# Patient Record
Sex: Female | Born: 1965
Health system: Southern US, Community
[De-identification: ages and names within clinical notes are randomized; demographics above are authoritative.]

## PROBLEM LIST (undated history)

## (undated) DIAGNOSIS — I10 Essential (primary) hypertension: Secondary | ICD-10-CM

## (undated) DIAGNOSIS — Z973 Presence of spectacles and contact lenses: Secondary | ICD-10-CM

## (undated) DIAGNOSIS — K760 Fatty (change of) liver, not elsewhere classified: Secondary | ICD-10-CM

## (undated) DIAGNOSIS — J301 Allergic rhinitis due to pollen: Secondary | ICD-10-CM

## (undated) DIAGNOSIS — R945 Abnormal results of liver function studies: Secondary | ICD-10-CM

## (undated) DIAGNOSIS — R7989 Other specified abnormal findings of blood chemistry: Secondary | ICD-10-CM

## (undated) HISTORY — DX: Fatty (change of) liver, not elsewhere classified: K76.0

## (undated) HISTORY — PX: CARPAL TUNNEL RELEASE: SHX101

## (undated) HISTORY — DX: Allergic rhinitis due to pollen: J30.1

## (undated) HISTORY — PX: TONSILLECTOMY: SUR1361

## (undated) HISTORY — PX: WISDOM TOOTH EXTRACTION: SHX21

## (undated) HISTORY — DX: Abnormal results of liver function studies: R94.5

## (undated) HISTORY — DX: Other specified abnormal findings of blood chemistry: R79.89

## (undated) HISTORY — DX: Essential (primary) hypertension: I10

---

## 1999-06-28 HISTORY — PX: KNEE ARTHROSCOPY W/ MENISCAL REPAIR: SHX1877

## 2010-02-21 LAB — HM PAP SMEAR: HM Pap smear: NORMAL

## 2012-02-22 ENCOUNTER — Ambulatory Visit (INDEPENDENT_AMBULATORY_CARE_PROVIDER_SITE_OTHER): Payer: PRIVATE HEALTH INSURANCE | Admitting: Internal Medicine

## 2012-02-22 ENCOUNTER — Encounter: Payer: Self-pay | Admitting: Internal Medicine

## 2012-02-22 VITALS — BP 130/100 | HR 71 | Temp 98.4°F | Ht 61.0 in | Wt 155.2 lb

## 2012-02-22 DIAGNOSIS — E785 Hyperlipidemia, unspecified: Secondary | ICD-10-CM

## 2012-02-22 DIAGNOSIS — I10 Essential (primary) hypertension: Secondary | ICD-10-CM

## 2012-02-22 DIAGNOSIS — Z1239 Encounter for other screening for malignant neoplasm of breast: Secondary | ICD-10-CM

## 2012-02-22 DIAGNOSIS — M25561 Pain in right knee: Secondary | ICD-10-CM | POA: Insufficient documentation

## 2012-02-22 DIAGNOSIS — M25569 Pain in unspecified knee: Secondary | ICD-10-CM

## 2012-02-22 DIAGNOSIS — E039 Hypothyroidism, unspecified: Secondary | ICD-10-CM

## 2012-02-22 LAB — MICROALBUMIN / CREATININE URINE RATIO
Microalb Creat Ratio: 1.1 mg/g (ref 0.0–30.0)
Microalb, Ur: 0.6 mg/dL (ref 0.0–1.9)

## 2012-02-22 LAB — COMPREHENSIVE METABOLIC PANEL
AST: 26 U/L (ref 0–37)
Alkaline Phosphatase: 51 U/L (ref 39–117)
BUN: 11 mg/dL (ref 6–23)
Creatinine, Ser: 0.7 mg/dL (ref 0.4–1.2)
Glucose, Bld: 84 mg/dL (ref 70–99)
Total Bilirubin: 0.6 mg/dL (ref 0.3–1.2)

## 2012-02-22 LAB — LDL CHOLESTEROL, DIRECT: Direct LDL: 127.2 mg/dL

## 2012-02-22 LAB — T4, FREE: Free T4: 0.81 ng/dL (ref 0.60–1.60)

## 2012-02-22 MED ORDER — HYDROCHLOROTHIAZIDE 25 MG PO TABS: 25.0000 mg | ORAL_TABLET | Freq: Every day | ORAL | Status: DC

## 2012-02-22 MED ORDER — ATENOLOL 25 MG PO TABS: 25.0000 mg | ORAL_TABLET | Freq: Every day | ORAL | Status: DC

## 2012-02-22 MED ORDER — AMLODIPINE BESYLATE 10 MG PO TABS: 10.0000 mg | ORAL_TABLET | Freq: Every day | ORAL | Status: DC

## 2012-02-22 NOTE — Assessment & Plan Note (Signed)
Blood pressure slightly elevated today. However, patient reports better control at home. We'll check renal function with labs. We'll continue current medications. Followup 6 months or sooner as needed.

## 2012-02-22 NOTE — Progress Notes (Signed)
Subjective:    Patient ID: Shelley Massey, female    DOB: Jul 11, 1965, 46 y.o.   MRN: [id number]  HPI 46 year old female with history of hypertension and hyperlipidemia presents to establish care. She reports that she is generally feeling well. She notes a history of meniscal tear and repair in her right knee. She reports that her initial repair failed and screw had to be removed from her knee. Since that time, she has had significant pain in her right knee and occasional swelling. She would like to set up orthopedic evaluation for this. She recently moved from [place] where she received her orthopedic care.  In regards to hypertension and hyperlipidemia, she reports full compliance with medications. She denies any headache, chest pain, palpitations. She is trying to improve her diet and increase physical activity in an effort to lose weight and improve her health. 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 She has received some care at the [facility] in [facility].  Outpatient Encounter Prescriptions as of 02/22/2012  Medication Sig Dispense Refill  . amLODipine (NORVASC) 10 MG tablet Take 1 tablet (10 mg total) by mouth daily.  90 tablet  4  . aspirin 81 MG tablet Take 81 mg by mouth daily.      Marland Kitchen atenolol (TENORMIN) 25 MG tablet Take 1 tablet (25 mg total) by mouth daily.  90 tablet  4  . Cholecalciferol (VITAMIN D-3) 1000 UNITS CAPS Take 1 capsule by mouth daily.      . Flaxseed, Linseed, (FLAXSEED OIL) 1000 MG CAPS Take 2 capsules by mouth daily.      . hydrochlorothiazide (HYDRODIURIL) 25 MG tablet Take 1 tablet (25 mg total) by mouth daily.  90 tablet  4  . Omega-3 Fatty Acids (FISH OIL) 1200 MG CAPS Take 2 capsules by mouth daily.       BP 130/100  Pulse 71  Temp 98.4 F (36.9 C) (Oral)  Ht 5\' 1"  (1.549 m)  Wt 155 lb 4 oz (70.421 kg)  BMI 29.33 kg/m2  SpO2 97%  LMP 02/11/2012  Review of Systems  Constitutional: Negative for fever, chills,  appetite change, fatigue and unexpected weight change.  HENT: Negative for ear pain, congestion, sore throat, trouble swallowing, neck pain, voice change and sinus pressure.   Eyes: Negative for visual disturbance.  Respiratory: Negative for cough, shortness of breath, wheezing and stridor.   Cardiovascular: Negative for chest pain, palpitations and leg swelling.  Gastrointestinal: Negative for nausea, vomiting, abdominal pain, diarrhea, constipation, blood in stool, abdominal distention and anal bleeding.  Genitourinary: Negative for dysuria and flank pain.  Musculoskeletal: Positive for myalgias and arthralgias. Negative for gait problem.  Skin: Negative for color change and rash.  Neurological: Negative for dizziness and headaches.  Hematological: Negative for adenopathy. Does not bruise/bleed easily.  Psychiatric/Behavioral: Negative for suicidal ideas, disturbed wake/sleep cycle and dysphoric mood. The patient is not nervous/anxious.        Objective:   Physical Exam  Constitutional: She is oriented to person, place, and time. She appears well-developed and well-nourished. No distress.  HENT:  Head: Normocephalic and atraumatic.  Right Ear: External ear normal.  Left Ear: External ear normal.  Nose: Nose normal.  Mouth/Throat: Oropharynx is clear and moist. No oropharyngeal exudate.  Eyes: Conjunctivae are normal. Pupils are equal, round, and reactive to light. Right eye exhibits no discharge. Left eye exhibits no discharge. No scleral icterus.  Neck: Normal range of motion.  Neck supple. No tracheal deviation present. No thyromegaly present.  Cardiovascular: Normal rate, regular rhythm, normal heart sounds and intact distal pulses.  Exam reveals no gallop and no friction rub.   No murmur heard. Pulmonary/Chest: Effort normal and breath sounds normal. No respiratory distress. She has no wheezes. She has no rales. She exhibits no tenderness.  Musculoskeletal: Normal range of motion.  She exhibits no edema and no tenderness.       Right knee: She exhibits normal range of motion, no swelling and no erythema. tenderness found. Medial joint line tenderness noted.  Lymphadenopathy:    She has no cervical adenopathy.  Neurological: She is alert and oriented to person, place, and time. No cranial nerve deficit. She exhibits normal muscle tone. Coordination normal.  Skin: Skin is warm and dry. No rash noted. She is not diaphoretic. No erythema. No pallor.  Psychiatric: She has a normal mood and affect. Her behavior is normal. Judgment and thought content normal.          Assessment & Plan:

## 2012-02-22 NOTE — Assessment & Plan Note (Signed)
History of hyperlipidemia in the past. Lab work today shows improvement in lipids. We'll plan to repeat LFTs and lipids in 6 months. Encouraged continued efforts at healthy diet high in fiber and low in saturated fat. Encourage increased physical activity.

## 2012-02-22 NOTE — Assessment & Plan Note (Signed)
Patient with right knee pain at previous surgical site. Will set up with orthopedics locally for evaluation.

## 2012-02-22 NOTE — Assessment & Plan Note (Signed)
Will set up screening mammogram. 

## 2012-02-24 ENCOUNTER — Encounter: Payer: Self-pay | Admitting: *Deleted

## 2012-08-29 ENCOUNTER — Encounter: Payer: PRIVATE HEALTH INSURANCE | Admitting: Internal Medicine

## 2012-12-25 ENCOUNTER — Ambulatory Visit (INDEPENDENT_AMBULATORY_CARE_PROVIDER_SITE_OTHER): Payer: 59 | Admitting: Adult Health

## 2012-12-25 ENCOUNTER — Encounter: Payer: Self-pay | Admitting: Adult Health

## 2012-12-25 VITALS — BP 126/68 | HR 66 | Resp 12 | Wt 155.0 lb

## 2012-12-25 DIAGNOSIS — M6740 Ganglion, unspecified site: Secondary | ICD-10-CM | POA: Insufficient documentation

## 2012-12-25 NOTE — Progress Notes (Signed)
  Subjective:    Patient ID: Shelley Massey, female    DOB: 08/16/1965, 47 y.o.   MRN: [id number]  HPI  Hx of ganglion cyst left base of thumb 7 years ago. She had a cortisone injection to the area with good results. Patient first noticed it after repetitive movements related to work. She is now in a different role and not using repetitive movements as much as previously. Patient is noticing however recurrence of this ganglion cyst. It is causing some discomfort. She still has good mobility and range of motion  Current Outpatient Prescriptions on File Prior to Visit  Medication Sig Dispense Refill  . amLODipine (NORVASC) 10 MG tablet Take 1 tablet (10 mg total) by mouth daily.  90 tablet  4  . aspirin 81 MG tablet Take 81 mg by mouth daily.      Marland Kitchen atenolol (TENORMIN) 25 MG tablet Take 1 tablet (25 mg total) by mouth daily.  90 tablet  4  . Flaxseed, Linseed, (FLAXSEED OIL) 1000 MG CAPS Take 2 capsules by mouth daily.      . hydrochlorothiazide (HYDRODIURIL) 25 MG tablet Take 1 tablet (25 mg total) by mouth daily.  90 tablet  4  . Omega-3 Fatty Acids (FISH OIL) 1200 MG CAPS Take 2 capsules by mouth daily.       No current facility-administered medications on file prior to visit.     Review of Systems  Positive for pain and discomfort and base of thumb on left hand. Negative for tingling, numbness or loss of sensation  BP 126/68  Pulse 66  Resp 12  Wt 155 lb (70.308 kg)  BMI 29.3 kg/m2  SpO2 96%     Objective:   Physical Exam  Left hand with ganglion cyst at base of thumb on dorsal surface. Good ROM. Tenderness on palpation. Mild edema at base of the thumb. Skin is intact without any s/s of infection or abscess.     Assessment & Plan:

## 2012-12-25 NOTE — Assessment & Plan Note (Signed)
Left base of thumb on dorsal surface. Ibuprofen 600 - 800 mg q 6 hours x 3-4 days. Immobilize area as much as possible with use of splint for 3-4 days. If no improvement within 1-2 weeks or if symptoms become severe will refer to ortho for steroid injection. Discussed that surgical removal is last option and not guaranteed that it will not reoccur.

## 2013-02-18 ENCOUNTER — Encounter: Payer: Self-pay | Admitting: General Practice

## 2013-02-25 ENCOUNTER — Encounter: Payer: Self-pay | Admitting: General Practice

## 2013-03-27 ENCOUNTER — Encounter: Payer: Self-pay | Admitting: General Practice

## 2013-04-15 ENCOUNTER — Other Ambulatory Visit: Payer: Self-pay | Admitting: *Deleted

## 2013-04-15 DIAGNOSIS — E785 Hyperlipidemia, unspecified: Secondary | ICD-10-CM

## 2013-04-15 DIAGNOSIS — Z1239 Encounter for other screening for malignant neoplasm of breast: Secondary | ICD-10-CM

## 2013-04-15 DIAGNOSIS — I10 Essential (primary) hypertension: Secondary | ICD-10-CM

## 2013-04-15 DIAGNOSIS — M25561 Pain in right knee: Secondary | ICD-10-CM

## 2013-04-15 DIAGNOSIS — E039 Hypothyroidism, unspecified: Secondary | ICD-10-CM

## 2013-04-15 MED ORDER — ATENOLOL 25 MG PO TABS: 25.0000 mg | ORAL_TABLET | Freq: Every day | ORAL | Status: DC

## 2013-04-26 ENCOUNTER — Other Ambulatory Visit: Payer: Self-pay | Admitting: *Deleted

## 2013-04-26 ENCOUNTER — Telehealth: Payer: Self-pay | Admitting: *Deleted

## 2013-04-26 DIAGNOSIS — I10 Essential (primary) hypertension: Secondary | ICD-10-CM

## 2013-04-26 DIAGNOSIS — M25561 Pain in right knee: Secondary | ICD-10-CM

## 2013-04-26 DIAGNOSIS — E785 Hyperlipidemia, unspecified: Secondary | ICD-10-CM

## 2013-04-26 DIAGNOSIS — Z1239 Encounter for other screening for malignant neoplasm of breast: Secondary | ICD-10-CM

## 2013-04-26 DIAGNOSIS — E039 Hypothyroidism, unspecified: Secondary | ICD-10-CM

## 2013-04-26 MED ORDER — HYDROCHLOROTHIAZIDE 25 MG PO TABS: 25.0000 mg | ORAL_TABLET | Freq: Every day | ORAL | Status: DC

## 2013-04-26 MED ORDER — AMLODIPINE BESYLATE 10 MG PO TABS: 10.0000 mg | ORAL_TABLET | Freq: Every day | ORAL | Status: DC

## 2013-04-26 NOTE — Telephone Encounter (Signed)
Refill Request  Hctz 25 mg tablet     #90  Take one tablet by mouth daily

## 2013-04-26 NOTE — Telephone Encounter (Signed)
appt 05/06/13

## 2013-05-06 ENCOUNTER — Ambulatory Visit (INDEPENDENT_AMBULATORY_CARE_PROVIDER_SITE_OTHER): Payer: 59 | Admitting: Internal Medicine

## 2013-05-06 ENCOUNTER — Encounter (INDEPENDENT_AMBULATORY_CARE_PROVIDER_SITE_OTHER): Payer: Self-pay

## 2013-05-06 ENCOUNTER — Encounter: Payer: Self-pay | Admitting: Internal Medicine

## 2013-05-06 VITALS — BP 158/98 | HR 77 | Temp 98.2°F | Wt 160.0 lb

## 2013-05-06 DIAGNOSIS — M25561 Pain in right knee: Secondary | ICD-10-CM

## 2013-05-06 DIAGNOSIS — E785 Hyperlipidemia, unspecified: Secondary | ICD-10-CM

## 2013-05-06 DIAGNOSIS — M6740 Ganglion, unspecified site: Secondary | ICD-10-CM

## 2013-05-06 DIAGNOSIS — Z1239 Encounter for other screening for malignant neoplasm of breast: Secondary | ICD-10-CM

## 2013-05-06 DIAGNOSIS — I10 Essential (primary) hypertension: Secondary | ICD-10-CM

## 2013-05-06 DIAGNOSIS — M25569 Pain in unspecified knee: Secondary | ICD-10-CM

## 2013-05-06 DIAGNOSIS — E039 Hypothyroidism, unspecified: Secondary | ICD-10-CM

## 2013-05-06 LAB — COMPREHENSIVE METABOLIC PANEL
Albumin: 4.2 g/dL (ref 3.5–5.2)
BUN: 11 mg/dL (ref 6–23)
CO2: 28 mEq/L (ref 19–32)
Calcium: 9.4 mg/dL (ref 8.4–10.5)
Chloride: 99 mEq/L (ref 96–112)
Creatinine, Ser: 0.7 mg/dL (ref 0.4–1.2)
GFR: 104.81 mL/min (ref >60.00)
Glucose, Bld: 98 mg/dL (ref 70–99)
Potassium: 4 mEq/L (ref 3.5–5.1)

## 2013-05-06 LAB — LDL CHOLESTEROL, DIRECT: Direct LDL: 142.9 mg/dL

## 2013-05-06 LAB — LIPID PANEL
HDL: 49.1 mg/dL (ref >39.00)
Triglycerides: 168 mg/dL — ABNORMAL HIGH (ref 0.0–149.0)

## 2013-05-06 LAB — MICROALBUMIN / CREATININE URINE RATIO
Creatinine,U: 134.6 mg/dL
Microalb Creat Ratio: 1.7 mg/g (ref 0.0–30.0)
Microalb, Ur: 2.3 mg/dL — ABNORMAL HIGH (ref 0.0–1.9)

## 2013-05-06 MED ORDER — AMLODIPINE BESYLATE 10 MG PO TABS: 10.0000 mg | ORAL_TABLET | Freq: Every day | ORAL | Status: DC

## 2013-05-06 MED ORDER — HYDROCHLOROTHIAZIDE 25 MG PO TABS: 25.0000 mg | ORAL_TABLET | Freq: Every day | ORAL | Status: DC

## 2013-05-06 MED ORDER — ATENOLOL 25 MG PO TABS: 25.0000 mg | ORAL_TABLET | Freq: Every day | ORAL | Status: DC

## 2013-05-06 NOTE — Progress Notes (Signed)
Subjective:    Patient ID: Shelley Massey, female    DOB: 04-06-1966, 47 y.o.   MRN: [id number]  HPI 47YO female with h/o hypertension presents for follow up. Compliant with BP meds. BP at home typically 120s/80s or lower. No recent chest pain, palpitations, dyspnea.  Concerned about persistent cystic area on wrist. Area has been drained in the past and also had steroid injection, however area continues to swell and be painful intermittently. Swelling "comes and goes" and is not currently present, however area remains tender. No known trauma to area.  Outpatient Encounter Prescriptions as of 05/06/2013  Medication Sig  . amLODipine (NORVASC) 10 MG tablet Take 1 tablet (10 mg total) by mouth daily.  Marland Kitchen aspirin 81 MG tablet Take 81 mg by mouth daily.  Marland Kitchen atenolol (TENORMIN) 25 MG tablet Take 1 tablet (25 mg total) by mouth daily.  . Flaxseed, Linseed, (FLAXSEED OIL) 1000 MG CAPS Take 2 capsules by mouth daily.  . hydrochlorothiazide (HYDRODIURIL) 25 MG tablet Take 1 tablet (25 mg total) by mouth daily.  . Omega-3 Fatty Acids (FISH OIL) 1200 MG CAPS Take 2 capsules by mouth daily.   BP 158/98  Pulse 77  Temp(Src) 98.2 F (36.8 C) (Oral)  Wt 160 lb (72.576 kg)  SpO2 97%  Review of Systems  Constitutional: Negative for fever, chills, appetite change, fatigue and unexpected weight change.  HENT: Negative for congestion, ear pain, sinus pressure, sore throat, trouble swallowing and voice change.   Eyes: Negative for visual disturbance.  Respiratory: Negative for cough, shortness of breath, wheezing and stridor.   Cardiovascular: Negative for chest pain, palpitations and leg swelling.  Gastrointestinal: Negative for nausea, vomiting, abdominal pain, diarrhea, constipation, blood in stool, abdominal distention and anal bleeding.  Genitourinary: Negative for dysuria and flank pain.  Musculoskeletal: Positive for myalgias. Negative for arthralgias, gait problem and neck pain.  Skin: Negative for  color change and rash.  Neurological: Negative for dizziness and headaches.  Hematological: Negative for adenopathy. Does not bruise/bleed easily.  Psychiatric/Behavioral: Negative for suicidal ideas, sleep disturbance and dysphoric mood. The patient is not nervous/anxious.        Objective:   Physical Exam  Constitutional: She is oriented to person, place, and time. She appears well-developed and well-nourished. No distress.  HENT:  Head: Normocephalic and atraumatic.  Right Ear: External ear normal.  Left Ear: External ear normal.  Nose: Nose normal.  Mouth/Throat: Oropharynx is clear and moist. No oropharyngeal exudate.  Eyes: Conjunctivae are normal. Pupils are equal, round, and reactive to light. Right eye exhibits no discharge. Left eye exhibits no discharge. No scleral icterus.  Neck: Normal range of motion. Neck supple. No tracheal deviation present. No thyromegaly present.  Cardiovascular: Normal rate, regular rhythm, normal heart sounds and intact distal pulses.  Exam reveals no gallop and no friction rub.   No murmur heard. Pulmonary/Chest: Effort normal and breath sounds normal. No accessory muscle usage. Not tachypneic. No respiratory distress. She has no decreased breath sounds. She has no wheezes. She has no rhonchi. She has no rales. She exhibits no tenderness.  Musculoskeletal: Normal range of motion. She exhibits no edema.       Left wrist: She exhibits tenderness and swelling.       Arms: Lymphadenopathy:    She has no cervical adenopathy.  Neurological: She is alert and oriented to person, place, and time. No cranial nerve deficit. She exhibits normal muscle tone. Coordination normal.  Skin: Skin is warm and dry. No rash  noted. She is not diaphoretic. No erythema. No pallor.  Psychiatric: She has a normal mood and affect. Her behavior is normal. Judgment and thought content normal.          Assessment & Plan:

## 2013-05-06 NOTE — Assessment & Plan Note (Signed)
Will check lipids and LFTs with labs today. 

## 2013-05-06 NOTE — Assessment & Plan Note (Signed)
BP Readings from Last 3 Encounters:  05/06/13 158/98  12/25/12 126/68  02/22/12 130/100   BP elevated today, however pt reports good control at home. Will continue current medications. Will check renal function with labs.

## 2013-05-06 NOTE — Assessment & Plan Note (Signed)
Left wrist ganglion cyst. Symptoms of pain and swelling persistent. Will set up evaluation with hand surgeon, Dr. Solon Augusta, for possible resection.

## 2013-05-06 NOTE — Progress Notes (Signed)
Pre-visit discussion using our clinic review tool. No additional management support is needed unless otherwise documented below in the visit note.  

## 2013-06-27 HISTORY — PX: GANGLION CYST EXCISION: SHX1691

## 2013-07-16 ENCOUNTER — Encounter: Payer: Self-pay | Admitting: Internal Medicine

## 2013-07-31 ENCOUNTER — Other Ambulatory Visit: Payer: Self-pay | Admitting: Orthopedic Surgery

## 2013-08-08 ENCOUNTER — Encounter (HOSPITAL_BASED_OUTPATIENT_CLINIC_OR_DEPARTMENT_OTHER)
Admission: RE | Admit: 2013-08-08 | Discharge: 2013-08-08 | Disposition: A | Discharge status: Home / Self Care | Payer: 59 | Source: Ambulatory Visit | Attending: Orthopedic Surgery | Admitting: Orthopedic Surgery

## 2013-08-08 ENCOUNTER — Encounter (HOSPITAL_BASED_OUTPATIENT_CLINIC_OR_DEPARTMENT_OTHER): Payer: Self-pay | Admitting: *Deleted

## 2013-08-08 DIAGNOSIS — Z01812 Encounter for preprocedural laboratory examination: Secondary | ICD-10-CM | POA: Insufficient documentation

## 2013-08-08 DIAGNOSIS — Z01810 Encounter for preprocedural cardiovascular examination: Secondary | ICD-10-CM | POA: Insufficient documentation

## 2013-08-08 LAB — BASIC METABOLIC PANEL
BUN: 19 mg/dL (ref 6–23)
CO2: 27 meq/L (ref 19–32)
Calcium: 9.5 mg/dL (ref 8.4–10.5)
Chloride: 96 mEq/L (ref 96–112)
Creatinine, Ser: 1.03 mg/dL (ref 0.50–1.10)
GFR calc Af Amer: 75 mL/min — ABNORMAL LOW (ref >90)
GFR, EST NON AFRICAN AMERICAN: 65 mL/min — AB (ref >90)
Glucose, Bld: 95 mg/dL (ref 70–99)
Potassium: 3.7 mEq/L (ref 3.7–5.3)
SODIUM: 138 meq/L (ref 137–147)

## 2013-08-08 NOTE — Progress Notes (Signed)
To come in for [id number]  

## 2013-08-12 ENCOUNTER — Encounter (HOSPITAL_BASED_OUTPATIENT_CLINIC_OR_DEPARTMENT_OTHER): Payer: Self-pay | Admitting: Anesthesiology

## 2013-08-12 ENCOUNTER — Encounter (HOSPITAL_BASED_OUTPATIENT_CLINIC_OR_DEPARTMENT_OTHER)
Admission: RE | Disposition: A | Discharge status: Home / Self Care | Payer: Self-pay | Source: Ambulatory Visit | Attending: Orthopedic Surgery

## 2013-08-12 ENCOUNTER — Ambulatory Visit (HOSPITAL_BASED_OUTPATIENT_CLINIC_OR_DEPARTMENT_OTHER)
Admission: RE | Admit: 2013-08-12 | Discharge: 2013-08-12 | Disposition: A | Discharge status: Home / Self Care | Payer: 59 | Source: Ambulatory Visit | Attending: Orthopedic Surgery | Admitting: Orthopedic Surgery

## 2013-08-12 ENCOUNTER — Ambulatory Visit (HOSPITAL_BASED_OUTPATIENT_CLINIC_OR_DEPARTMENT_OTHER): Payer: 59 | Admitting: Anesthesiology

## 2013-08-12 ENCOUNTER — Encounter (HOSPITAL_BASED_OUTPATIENT_CLINIC_OR_DEPARTMENT_OTHER): Payer: 59 | Admitting: Anesthesiology

## 2013-08-12 DIAGNOSIS — I10 Essential (primary) hypertension: Secondary | ICD-10-CM | POA: Insufficient documentation

## 2013-08-12 DIAGNOSIS — Z7982 Long term (current) use of aspirin: Secondary | ICD-10-CM | POA: Insufficient documentation

## 2013-08-12 DIAGNOSIS — M6740 Ganglion, unspecified site: Secondary | ICD-10-CM

## 2013-08-12 DIAGNOSIS — R229 Localized swelling, mass and lump, unspecified: Secondary | ICD-10-CM | POA: Insufficient documentation

## 2013-08-12 HISTORY — DX: Presence of spectacles and contact lenses: Z97.3

## 2013-08-12 HISTORY — PX: MASS EXCISION: SHX2000

## 2013-08-12 LAB — POCT HEMOGLOBIN-HEMACUE: Hemoglobin: 15.1 g/dL — ABNORMAL HIGH (ref 12.0–15.0)

## 2013-08-12 SURGERY — EXCISION MASS
Anesthesia: General | Site: Hand | Laterality: Left

## 2013-08-12 MED ORDER — OXYCODONE-ACETAMINOPHEN 5-325 MG PO TABS: 1.0000 | ORAL_TABLET | ORAL | Status: DC | PRN

## 2013-08-12 MED ORDER — MIDAZOLAM HCL 2 MG/2ML IJ SOLN: 1.0000 mg | INTRAMUSCULAR | Status: DC | PRN

## 2013-08-12 MED ORDER — DEXAMETHASONE SODIUM PHOSPHATE 4 MG/ML IJ SOLN
INTRAMUSCULAR | Status: DC | PRN
  Administered 2013-08-12: 10 mg via INTRAVENOUS

## 2013-08-12 MED ORDER — LACTATED RINGERS IV SOLN
INTRAVENOUS | Status: DC
  Administered 2013-08-12 (×2): via INTRAVENOUS

## 2013-08-12 MED ORDER — CHLORHEXIDINE GLUCONATE 4 % EX LIQD: 60.0000 mL | Freq: Once | CUTANEOUS | Status: DC

## 2013-08-12 MED ORDER — MIDAZOLAM HCL 5 MG/5ML IJ SOLN
INTRAMUSCULAR | Status: DC | PRN
Start: 2013-08-12 — End: 2013-08-12
  Administered 2013-08-12: 2 mg via INTRAVENOUS

## 2013-08-12 MED ORDER — HYDROMORPHONE HCL PF 1 MG/ML IJ SOLN: 0.2500 mg | INTRAMUSCULAR | Status: DC | PRN

## 2013-08-12 MED ORDER — FENTANYL CITRATE 0.05 MG/ML IJ SOLN
INTRAMUSCULAR | Status: DC | PRN
  Administered 2013-08-12: 100 ug via INTRAVENOUS

## 2013-08-12 MED ORDER — FENTANYL CITRATE 0.05 MG/ML IJ SOLN
INTRAMUSCULAR | Status: AC
  Filled 2013-08-12: qty 4

## 2013-08-12 MED ORDER — BUPIVACAINE HCL (PF) 0.25 % IJ SOLN
INTRAMUSCULAR | Status: DC | PRN
Start: 2013-08-12 — End: 2013-08-12
  Administered 2013-08-12: 8 mL

## 2013-08-12 MED ORDER — OXYCODONE HCL 5 MG PO TABS: 5.0000 mg | ORAL_TABLET | Freq: Once | ORAL | Status: DC | PRN

## 2013-08-12 MED ORDER — OXYCODONE HCL 5 MG/5ML PO SOLN: 5.0000 mg | Freq: Once | ORAL | Status: DC | PRN

## 2013-08-12 MED ORDER — LIDOCAINE HCL (CARDIAC) 20 MG/ML IV SOLN
INTRAVENOUS | Status: DC | PRN
  Administered 2013-08-12: 60 mg via INTRAVENOUS

## 2013-08-12 MED ORDER — CEFAZOLIN SODIUM-DEXTROSE 2-3 GM-% IV SOLR
INTRAVENOUS | Status: AC
  Filled 2013-08-12: qty 50

## 2013-08-12 MED ORDER — CEFAZOLIN SODIUM-DEXTROSE 2-3 GM-% IV SOLR
2.0000 g | INTRAVENOUS | Status: AC
  Administered 2013-08-12: 2 g via INTRAVENOUS

## 2013-08-12 MED ORDER — PROPOFOL 10 MG/ML IV BOLUS
INTRAVENOUS | Status: DC | PRN
  Administered 2013-08-12: 25 mg via INTRAVENOUS
  Administered 2013-08-12: 150 mg via INTRAVENOUS
  Administered 2013-08-12: 25 mg via INTRAVENOUS

## 2013-08-12 MED ORDER — FENTANYL CITRATE 0.05 MG/ML IJ SOLN: 50.0000 ug | INTRAMUSCULAR | Status: DC | PRN

## 2013-08-12 MED ORDER — ONDANSETRON HCL 4 MG/2ML IJ SOLN
INTRAMUSCULAR | Status: DC | PRN
  Administered 2013-08-12: 4 mg via INTRAVENOUS

## 2013-08-12 MED ORDER — MIDAZOLAM HCL 2 MG/2ML IJ SOLN
INTRAMUSCULAR | Status: AC
  Filled 2013-08-12: qty 2

## 2013-08-12 MED ORDER — BUPIVACAINE HCL (PF) 0.25 % IJ SOLN
INTRAMUSCULAR | Status: AC
  Filled 2013-08-12: qty 30

## 2013-08-12 SURGICAL SUPPLY — 45 items
APL SKNCLS STERI-STRIP NONHPOA (GAUZE/BANDAGES/DRESSINGS) ×1
BAG DECANTER FOR FLEXI CONT (MISCELLANEOUS) IMPLANT
BANDAGE ELASTIC 3 VELCRO ST LF (GAUZE/BANDAGES/DRESSINGS) ×2 IMPLANT
BANDAGE ELASTIC 4 VELCRO ST LF (GAUZE/BANDAGES/DRESSINGS) ×2 IMPLANT
BENZOIN TINCTURE PRP APPL 2/3 (GAUZE/BANDAGES/DRESSINGS) ×2 IMPLANT
BLADE SURG 15 STRL LF DISP TIS (BLADE) ×1 IMPLANT
BLADE SURG 15 STRL SS (BLADE) ×2
BNDG CMPR 9X4 STRL LF SNTH (GAUZE/BANDAGES/DRESSINGS) ×1
BNDG ESMARK 4X9 LF (GAUZE/BANDAGES/DRESSINGS) ×2 IMPLANT
BNDG GAUZE ELAST 4 BULKY (GAUZE/BANDAGES/DRESSINGS) ×2 IMPLANT
CORDS BIPOLAR (ELECTRODE) ×2 IMPLANT
COVER TABLE BACK 60X90 (DRAPES) ×2 IMPLANT
CUFF TOURNIQUET SINGLE 18IN (TOURNIQUET CUFF) ×2 IMPLANT
DECANTER SPIKE VIAL GLASS SM (MISCELLANEOUS) IMPLANT
DRAPE EXTREMITY T 121X128X90 (DRAPE) ×2 IMPLANT
DRAPE SURG 17X23 STRL (DRAPES) ×2 IMPLANT
DURAPREP 26ML APPLICATOR (WOUND CARE) ×2 IMPLANT
GAUZE XEROFORM 1X8 LF (GAUZE/BANDAGES/DRESSINGS) ×2 IMPLANT
GLOVE BIOGEL PI IND STRL 7.0 (GLOVE) ×1 IMPLANT
GLOVE BIOGEL PI INDICATOR 7.0 (GLOVE) ×1
GLOVE ECLIPSE 6.5 STRL STRAW (GLOVE) ×2 IMPLANT
GLOVE SURG SYN 8.0 (GLOVE) ×2 IMPLANT
GOWN STRL REUS W/ TWL LRG LVL3 (GOWN DISPOSABLE) ×1 IMPLANT
GOWN STRL REUS W/TWL LRG LVL3 (GOWN DISPOSABLE) ×2
GOWN STRL REUS W/TWL XL LVL3 (GOWN DISPOSABLE) ×2 IMPLANT
NEEDLE HYPO 25X1 1.5 SAFETY (NEEDLE) ×2 IMPLANT
NS IRRIG 1000ML POUR BTL (IV SOLUTION) ×2 IMPLANT
PACK BASIN DAY SURGERY FS (CUSTOM PROCEDURE TRAY) ×2 IMPLANT
PAD CAST 3X4 CTTN HI CHSV (CAST SUPPLIES) ×1 IMPLANT
PADDING CAST COTTON 3X4 STRL (CAST SUPPLIES) ×2
SHEET MEDIUM DRAPE 40X70 STRL (DRAPES) ×2 IMPLANT
SPLINT PLASTER CAST XFAST 4X15 (CAST SUPPLIES) IMPLANT
SPLINT PLASTER XTRA FAST SET 4 (CAST SUPPLIES)
SPONGE GAUZE 4X4 12PLY (GAUZE/BANDAGES/DRESSINGS) ×2 IMPLANT
STOCKINETTE 4X48 STRL (DRAPES) ×2 IMPLANT
STRIP CLOSURE SKIN 1/2X4 (GAUZE/BANDAGES/DRESSINGS) ×2 IMPLANT
SUT ETHILON 5 0 PS 2 18 (SUTURE) IMPLANT
SUT PROLENE 3 0 PS 2 (SUTURE) IMPLANT
SUT VIC AB 4-0 P-3 18XBRD (SUTURE) IMPLANT
SUT VIC AB 4-0 P3 18 (SUTURE)
SUT VICRYL RAPIDE 4/0 PS 2 (SUTURE) ×2 IMPLANT
SYR BULB 3OZ (MISCELLANEOUS) ×2 IMPLANT
SYRINGE 10CC LL (SYRINGE) ×2 IMPLANT
TOWEL OR 17X24 6PK STRL BLUE (TOWEL DISPOSABLE) ×2 IMPLANT
UNDERPAD 30X30 INCONTINENT (UNDERPADS AND DIAPERS) ×2 IMPLANT

## 2013-08-12 NOTE — Anesthesia Postprocedure Evaluation (Signed)
  Anesthesia Post-op Note  Patient: Shelley Massey  Procedure(s) Performed: Procedure(s): EXCISION VOLAR MASS LEFT WRIST AND THUMB (Left)  Patient Location: PACU  Anesthesia Type:General  Level of Consciousness: awake and alert   Airway and Oxygen Therapy: Patient Spontanous Breathing  Post-op Pain: none  Post-op Assessment: Post-op Vital signs reviewed, Patient's Cardiovascular Status Stable and Respiratory Function Stable  Post-op Vital Signs: Reviewed  Filed Vitals:   08/12/13 1400  BP: 144/80  Pulse: 63  Temp: 36.6 C  Resp: 16    Complications: No apparent anesthesia complications

## 2013-08-12 NOTE — Anesthesia Procedure Notes (Signed)
Procedure Name: LMA Insertion Date/Time: 08/12/2013 12:52 PM Performed by: Toula Moos Pre-anesthesia Checklist: Patient identified, Emergency Drugs available, Suction available, Patient being monitored and Timeout performed Patient Re-evaluated:Patient Re-evaluated prior to inductionOxygen Delivery Method: Circle system utilized Intubation Type: IV induction Ventilation: Mask ventilation without difficulty LMA: LMA inserted LMA Size: 4.0 Number of attempts: 1 Placement Confirmation: positive ETCO2 and breath sounds checked- equal and bilateral Tube secured with: Tape

## 2013-08-12 NOTE — Op Note (Signed)
See note [id number]

## 2013-08-12 NOTE — H&P (Signed)
Shelley Massey is an 48 y.o. female.   Chief Complaint: left thumb and wrist masses HPI: as above with h/o above  Past Medical History  Diagnosis Date  . Hypertension   . Hay fever   . Wears glasses     Past Surgical History  Procedure Laterality Date  . Knee arthroscopy w/ meniscal repair  2001    [place], s/p repair x 2-right  . Carpal tunnel release      bilateral  . Tonsillectomy    . Wisdom tooth extraction      Family History  Problem Relation Age of Onset  . Adopted: Yes  . Hypertension Sister    Social History:  reports that she has never smoked. She does not have any smokeless tobacco history on file. She reports that she drinks alcohol. Her drug history is not on file.  Allergies: No Known Allergies  Medications Prior to Admission  Medication Sig Dispense Refill  . amLODipine (NORVASC) 10 MG tablet Take 1 tablet (10 mg total) by mouth daily.  90 tablet  3  . aspirin 81 MG tablet Take 81 mg by mouth daily.      Marland Kitchen atenolol (TENORMIN) 25 MG tablet Take 1 tablet (25 mg total) by mouth daily.  90 tablet  3  . Flaxseed, Linseed, (FLAXSEED OIL) 1000 MG CAPS Take 2 capsules by mouth daily.      . hydrochlorothiazide (HYDRODIURIL) 25 MG tablet Take 1 tablet (25 mg total) by mouth daily.  90 tablet  3  . Omega-3 Fatty Acids (FISH OIL) 1200 MG CAPS Take 2 capsules by mouth daily.        No results found for this or any previous visit (from the past 48 hour(s)). No results found.  Review of Systems  All other systems reviewed and are negative.    Blood pressure 151/100, pulse 66, temperature 98 F (36.7 C), temperature source Oral, resp. rate 16, height 5\' 1"  (1.549 m), weight 72.576 kg (160 lb), last menstrual period 08/06/2013, SpO2 99.00%. Physical Exam  Constitutional: She is oriented to person, place, and time. She appears well-developed and well-nourished.  HENT:  Head: Normocephalic and atraumatic.  Cardiovascular: Normal rate.   Respiratory: Effort normal.   Musculoskeletal:       Left hand: She exhibits swelling.  Left thumb and wrist volar masses  Neurological: She is alert and oriented to person, place, and time.  Skin: Skin is warm.  Psychiatric: She has a normal mood and affect. Her behavior is normal. Judgment and thought content normal.     Assessment/Plan As above   Plan excision of above  Bryann Gentz A 08/12/2013, 12:40 PM

## 2013-08-12 NOTE — Anesthesia Preprocedure Evaluation (Signed)
Anesthesia Evaluation  Patient identified by MRN, date of birth, ID band Patient awake    Reviewed: Allergy & Precautions, H&P , NPO status , Patient's Chart, lab work & pertinent test results, reviewed documented beta blocker date and time   Airway Mallampati: III TM Distance: >3 FB Neck ROM: Full    Dental no notable dental hx. (+) Teeth Intact, Dental Advisory Given   Pulmonary neg pulmonary ROS,  breath sounds clear to auscultation  Pulmonary exam normal       Cardiovascular hypertension, On Medications and On Home Beta Blockers Rhythm:Regular Rate:Normal     Neuro/Psych negative neurological ROS  negative psych ROS   GI/Hepatic negative GI ROS, Neg liver ROS,   Endo/Other  negative endocrine ROS  Renal/GU negative Renal ROS  negative genitourinary   Musculoskeletal   Abdominal   Peds  Hematology negative hematology ROS (+)   Anesthesia Other Findings   Reproductive/Obstetrics negative OB ROS                           Anesthesia Physical Anesthesia Plan  ASA: II  Anesthesia Plan: General   Post-op Pain Management:    Induction: Intravenous  Airway Management Planned: LMA  Additional Equipment:   Intra-op Plan:   Post-operative Plan: Extubation in OR  Informed Consent: I have reviewed the patients History and Physical, chart, labs and discussed the procedure including the risks, benefits and alternatives for the proposed anesthesia with the patient or authorized representative who has indicated his/her understanding and acceptance.   Dental advisory given  Plan Discussed with: CRNA  Anesthesia Plan Comments:         Anesthesia Quick Evaluation

## 2013-08-12 NOTE — Transfer of Care (Signed)
Immediate Anesthesia Transfer of Care Note  Patient: Shelley Massey  Procedure(s) Performed: Procedure(s): EXCISION VOLAR MASS LEFT WRIST AND THUMB (Left)  Patient Location: PACU  Anesthesia Type:General  Level of Consciousness: awake, sedated and patient cooperative  Airway & Oxygen Therapy: Patient Spontanous Breathing and Patient connected to face mask oxygen  Post-op Assessment: Report given to PACU RN and Post -op Vital signs reviewed and stable  Post vital signs: Reviewed and stable  Complications: No apparent anesthesia complications

## 2013-08-12 NOTE — Discharge Instructions (Signed)

## 2013-08-13 ENCOUNTER — Encounter (HOSPITAL_BASED_OUTPATIENT_CLINIC_OR_DEPARTMENT_OTHER): Payer: Self-pay | Admitting: Orthopedic Surgery

## 2013-08-13 NOTE — Op Note (Signed)
Shelley Massey, Shelley Massey              ACCOUNT NO.:  [id number]  MEDICAL RECORD NO.:  [id number]  LOCATION:                                 FACILITY:  PHYSICIAN:  Sheral Apley. Tiburcio Linder, M.D.DATE OF BIRTH:  1966-01-18  DATE OF PROCEDURE:  08/12/2013 DATE OF DISCHARGE:  08/12/2013                              OPERATIVE REPORT   PREOPERATIVE DIAGNOSIS:  Left thumb and left wrist volar masses.  POSTOPERATIVE DIAGNOSIS:  Left thumb and left wrist volar masses.  PROCEDURE:  Excisional biopsy, left thumb mass deep and left wrist mass deep.  SURGEON:  Sheral Apley. Burney Gauze, M.D.  ASSISTANT:  None.  ANESTHESIA:  General.  COMPLICATION:  None.  DRAINS:  None.  SPECIMENS:  Two specimens sent.  DESCRIPTION OF PROCEDURE:  The patient was taken to the operating suite. After induction of adequate general anesthesia, left upper extremity was prepped and draped in usual sterile fashion.  An Esmarch was used to exsanguinate the limb.  Tourniquet was inflated to 250 mmHg.  Prior to coming to the operating room, we remarked 2-mm tender spots, one at the base of the thenar musculature and one over the area just radial to the palmaris longus tendon insertion.  We then again prepped and draped in usual sterile fashion.  We raised the tourniquet after exsanguination with an Esmarch.  We made incision over the left thenar muscle base approximately 2 cm over the most tender area that was marked preoperatively and also over the FCR palmaris longus area.  The thenar muscles were split.  Dissection was carried down to a small mass within the thenar muscle.  This mass along with the thenar muscle was carefully excised.  The wound was irrigated and loosely closed with a 4-0 Vicryl Rapide subcuticular stitch.  Second incision was made over the palmaris longus FCR area on the left side.  Skin was incised.  There was significant thickening of the sheath overlying the palmaris longus tendon, and what appeared  to be a lesion coming off the tendon.  We carefully excised the tendon sheath and this lesion, and sent for pathologic confirmation. Wound was thoroughly irrigated.  Muscle loosely closed with a 4-0 Vicryl Rapide subcuticular stitch.  Steri-Strips, 4x4s, fluffs, and compressive dressing were applied.  The patient tolerated both procedures well and went to the recovery room in stable fashion.     Sheral Apley Burney Gauze, M.D.     MAW/MEDQ  D:  08/12/2013  T:  08/13/2013  Job:  [id number]

## 2013-11-08 ENCOUNTER — Ambulatory Visit (INDEPENDENT_AMBULATORY_CARE_PROVIDER_SITE_OTHER): Payer: 59 | Admitting: Internal Medicine

## 2013-11-08 ENCOUNTER — Telehealth: Payer: Self-pay | Admitting: Internal Medicine

## 2013-11-08 ENCOUNTER — Encounter: Payer: Self-pay | Admitting: Internal Medicine

## 2013-11-08 VITALS — BP 134/86 | HR 88 | Temp 98.1°F | Wt 159.2 lb

## 2013-11-08 DIAGNOSIS — E785 Hyperlipidemia, unspecified: Secondary | ICD-10-CM

## 2013-11-08 DIAGNOSIS — J309 Allergic rhinitis, unspecified: Secondary | ICD-10-CM | POA: Insufficient documentation

## 2013-11-08 DIAGNOSIS — I10 Essential (primary) hypertension: Secondary | ICD-10-CM

## 2013-11-08 DIAGNOSIS — Z1239 Encounter for other screening for malignant neoplasm of breast: Secondary | ICD-10-CM

## 2013-11-08 LAB — LIPID PANEL
CHOL/HDL RATIO: 5
Cholesterol: 237 mg/dL — ABNORMAL HIGH (ref 0–200)
HDL: 45 mg/dL (ref >39.00)
LDL Cholesterol: 138 mg/dL — ABNORMAL HIGH (ref 0–99)
Triglycerides: 271 mg/dL — ABNORMAL HIGH (ref 0.0–149.0)
VLDL: 54.2 mg/dL — ABNORMAL HIGH (ref 0.0–40.0)

## 2013-11-08 LAB — COMPREHENSIVE METABOLIC PANEL
ALT: 69 U/L — ABNORMAL HIGH (ref 0–35)
AST: 54 U/L — AB (ref 0–37)
Albumin: 4.4 g/dL (ref 3.5–5.2)
Alkaline Phosphatase: 58 U/L (ref 39–117)
BUN: 8 mg/dL (ref 6–23)
CALCIUM: 9.2 mg/dL (ref 8.4–10.5)
CHLORIDE: 100 meq/L (ref 96–112)
CO2: 26 meq/L (ref 19–32)
Creatinine, Ser: 0.6 mg/dL (ref 0.4–1.2)
GFR: 106.45 mL/min (ref >60.00)
Glucose, Bld: 90 mg/dL (ref 70–99)
POTASSIUM: 3.3 meq/L — AB (ref 3.5–5.1)
SODIUM: 139 meq/L (ref 135–145)
TOTAL PROTEIN: 8.5 g/dL — AB (ref 6.0–8.3)
Total Bilirubin: 0.7 mg/dL (ref 0.2–1.2)

## 2013-11-08 LAB — MICROALBUMIN / CREATININE URINE RATIO
CREATININE, U: 106.4 mg/dL
Microalb Creat Ratio: 1.6 mg/g (ref 0.0–30.0)
Microalb, Ur: 1.7 mg/dL (ref 0.0–1.9)

## 2013-11-08 NOTE — Progress Notes (Signed)
Pre visit review using our clinic review tool, if applicable. No additional management support is needed unless otherwise documented below in the visit note. 

## 2013-11-08 NOTE — Telephone Encounter (Signed)
Relevant patient education assigned to patient using Emmi. ° °

## 2013-11-08 NOTE — Assessment & Plan Note (Addendum)
BP Readings from Last 3 Encounters:  11/08/13 134/86  08/12/13 144/80  08/12/13 144/80   BP generally well controlled, however slightly elevated today, did not take meds.Will check renal function with labs today. Continue atenolol, amlodipine and HCTZ.

## 2013-11-08 NOTE — Assessment & Plan Note (Signed)
Will check lipids and LFTs with labs today. 

## 2013-11-08 NOTE — Assessment & Plan Note (Signed)
Encouraged use of non-sedating antihistamines, OTC nasal steroid, and saline wash as she has been. If symptoms persistent, consider referral for allergy testing.

## 2013-11-08 NOTE — Progress Notes (Signed)
Subjective:    Patient ID: Shelley Massey, female    DOB: 04-19-1966, 48 y.o.   MRN: [id number]  HPI 48YO female presents for follow up.  Seasonal allergies - sneezing, clear nasal drainage, ear pressure, hoarse voice for last few weeks. No improvement with OTC meds including Advil Cold and Flonase. Some improvement with nasal saline.  HTN - Compliant with meds, however did not take this morning. No chest pain, headache, palpitations.  Review of Systems  Constitutional: Negative for fever, chills, appetite change, fatigue and unexpected weight change.  HENT: Positive for congestion, postnasal drip, rhinorrhea and sneezing. Negative for ear pain, sinus pressure, sore throat, trouble swallowing and voice change.   Eyes: Negative for visual disturbance.  Respiratory: Negative for cough, shortness of breath, wheezing and stridor.   Cardiovascular: Negative for chest pain, palpitations and leg swelling.  Gastrointestinal: Negative for nausea, vomiting, abdominal pain, diarrhea, constipation, blood in stool, abdominal distention and anal bleeding.  Genitourinary: Negative for dysuria and flank pain.  Musculoskeletal: Negative for arthralgias, gait problem, myalgias and neck pain.  Skin: Negative for color change and rash.  Neurological: Negative for dizziness and headaches.  Hematological: Negative for adenopathy. Does not bruise/bleed easily.  Psychiatric/Behavioral: Negative for suicidal ideas, sleep disturbance and dysphoric mood. The patient is not nervous/anxious.        Objective:    BP 134/86  Pulse 88  Temp(Src) 98.1 F (36.7 C) (Oral)  Wt 159 lb 4 oz (72.235 kg)  SpO2 95%  LMP 11/04/2013 Physical Exam  Constitutional: She is oriented to person, place, and time. She appears well-developed and well-nourished. No distress.  HENT:  Head: Normocephalic and atraumatic.  Right Ear: External ear normal. A middle ear effusion is present.  Left Ear: External ear normal. A middle  ear effusion is present.  Nose: Mucosal edema and rhinorrhea present.  Mouth/Throat: Oropharynx is clear and moist. No oropharyngeal exudate.  Eyes: Conjunctivae are normal. Pupils are equal, round, and reactive to light. Right eye exhibits no discharge. Left eye exhibits no discharge. No scleral icterus.  Neck: Normal range of motion. Neck supple. No tracheal deviation present. No thyromegaly present.  Cardiovascular: Normal rate, regular rhythm, normal heart sounds and intact distal pulses.  Exam reveals no gallop and no friction rub.   No murmur heard. Pulmonary/Chest: Effort normal and breath sounds normal. No accessory muscle usage. Not tachypneic. No respiratory distress. She has no decreased breath sounds. She has no wheezes. She has no rhonchi. She has no rales. She exhibits no tenderness.  Musculoskeletal: Normal range of motion. She exhibits no edema and no tenderness.  Lymphadenopathy:    She has no cervical adenopathy.  Neurological: She is alert and oriented to person, place, and time. No cranial nerve deficit. She exhibits normal muscle tone. Coordination normal.  Skin: Skin is warm and dry. No rash noted. She is not diaphoretic. No erythema. No pallor.  Psychiatric: She has a normal mood and affect. Her behavior is normal. Judgment and thought content normal.          Assessment & Plan:   Problem List Items Addressed This Visit   Allergic rhinitis     Encouraged use of non-sedating antihistamines, OTC nasal steroid, and saline wash as she has been. If symptoms persistent, consider referral for allergy testing.    Hyperlipidemia LDL goal < 100     Will check lipids and LFTs with labs today.    Relevant Orders      Lipid panel  Hypertension - Primary      BP Readings from Last 3 Encounters:  11/08/13 134/86  08/12/13 144/80  08/12/13 144/80   BP generally well controlled, however slightly elevated today, did not take meds.Will check renal function with labs today.  Continue atenolol, amlodipine and HCTZ.    Relevant Orders      Comprehensive metabolic panel      Microalbumin / creatinine urine ratio   Screening for breast cancer   Relevant Orders      MM Digital Screening       Return in about 6 months (around 05/11/2014) for Physical.

## 2013-11-13 ENCOUNTER — Encounter: Payer: Self-pay | Admitting: Internal Medicine

## 2013-11-13 ENCOUNTER — Other Ambulatory Visit: Payer: 59

## 2013-11-13 DIAGNOSIS — R7989 Other specified abnormal findings of blood chemistry: Secondary | ICD-10-CM

## 2013-11-13 DIAGNOSIS — R945 Abnormal results of liver function studies: Principal | ICD-10-CM

## 2013-11-14 LAB — COMPLETE METABOLIC PANEL WITH GFR
ALBUMIN: 4.2 g/dL (ref 3.5–5.2)
ALT: 76 U/L — ABNORMAL HIGH (ref 0–35)
AST: 48 U/L — ABNORMAL HIGH (ref 0–37)
Alkaline Phosphatase: 48 U/L (ref 39–117)
BILIRUBIN TOTAL: 0.4 mg/dL (ref 0.2–1.2)
BUN: 11 mg/dL (ref 6–23)
CO2: 29 mEq/L (ref 19–32)
Calcium: 9.4 mg/dL (ref 8.4–10.5)
Chloride: 97 mEq/L (ref 96–112)
Creat: 0.61 mg/dL (ref 0.50–1.10)
GFR, Est African American: >89 mL/min
GLUCOSE: 98 mg/dL (ref 70–99)
POTASSIUM: 3.4 meq/L — AB (ref 3.5–5.3)
SODIUM: 135 meq/L (ref 135–145)
Total Protein: 7.6 g/dL (ref 6.0–8.3)

## 2013-11-15 ENCOUNTER — Ambulatory Visit (INDEPENDENT_AMBULATORY_CARE_PROVIDER_SITE_OTHER): Payer: 59 | Admitting: Internal Medicine

## 2013-11-15 ENCOUNTER — Encounter: Payer: Self-pay | Admitting: Internal Medicine

## 2013-11-15 VITALS — BP 146/84 | HR 79 | Temp 98.2°F | Ht 61.0 in | Wt 161.5 lb

## 2013-11-15 DIAGNOSIS — R7989 Other specified abnormal findings of blood chemistry: Secondary | ICD-10-CM | POA: Insufficient documentation

## 2013-11-15 DIAGNOSIS — R945 Abnormal results of liver function studies: Principal | ICD-10-CM

## 2013-11-15 LAB — AMMONIA: Ammonia: 44 umol/L (ref 16–53)

## 2013-11-15 LAB — PROTIME-INR
INR: 1.1 ratio — AB (ref 0.8–1.0)
Prothrombin Time: 11.9 s (ref 9.6–13.1)

## 2013-11-15 NOTE — Addendum Note (Signed)
Addended by: Karlene Einstein D on: 11/15/2013 09:53 AM   Modules accepted: Orders

## 2013-11-15 NOTE — Progress Notes (Signed)
Pre visit review using our clinic review tool, if applicable. No additional management support is needed unless otherwise documented below in the visit note. 

## 2013-11-15 NOTE — Assessment & Plan Note (Signed)
Discussed potential causes of elevated LFTs. Will check for viral hepatitis, Wilson's, autoimmune hepatitis. Encouraged avoidance of alcohol. Will check liver US. Follow up in 2 weeks.

## 2013-11-15 NOTE — Patient Instructions (Signed)
Labs today.  We will schedule a liver ultrasound.  Follow up in 2 weeks.

## 2013-11-15 NOTE — Progress Notes (Signed)
Subjective:    Patient ID: Shelley Massey, female    DOB: September 24, 1965, 48 y.o.   MRN: [id number]  HPI 48YO female presents to follow up recent labs. Labs showed elevated LFTs.  Not using any supplemental medications. No h/o liver issues. Feeling generally well.  Review of Systems  Constitutional: Negative for fever, chills, appetite change, fatigue and unexpected weight change.  HENT: Negative for congestion, ear pain, sinus pressure, sore throat, trouble swallowing and voice change.   Eyes: Negative for visual disturbance.  Respiratory: Negative for cough, shortness of breath, wheezing and stridor.   Cardiovascular: Negative for chest pain, palpitations and leg swelling.  Gastrointestinal: Negative for nausea, vomiting, abdominal pain, diarrhea, constipation, blood in stool, abdominal distention and anal bleeding.  Genitourinary: Negative for dysuria and flank pain.  Musculoskeletal: Negative for arthralgias, gait problem, myalgias and neck pain.  Skin: Negative for color change and rash.  Neurological: Negative for dizziness and headaches.  Hematological: Negative for adenopathy. Does not bruise/bleed easily.  Psychiatric/Behavioral: Negative for suicidal ideas, sleep disturbance and dysphoric mood. The patient is not nervous/anxious.        Objective:    BP 146/84  Pulse 79  Temp(Src) 98.2 F (36.8 C) (Oral)  Ht 5\' 1"  (1.549 m)  Wt 161 lb 8 oz (73.256 kg)  BMI 30.53 kg/m2  SpO2 98%  LMP 11/04/2013 Physical Exam  Constitutional: She is oriented to person, place, and time. She appears well-developed and well-nourished. No distress.  HENT:  Head: Normocephalic and atraumatic.  Right Ear: External ear normal.  Left Ear: External ear normal.  Nose: Nose normal.  Mouth/Throat: Oropharynx is clear and moist.  Eyes: Conjunctivae are normal. Pupils are equal, round, and reactive to light. Right eye exhibits no discharge. Left eye exhibits no discharge. No scleral icterus.    Neck: Normal range of motion. Neck supple. No tracheal deviation present. No thyromegaly present.  Pulmonary/Chest: Effort normal. No accessory muscle usage. Not tachypneic. She has no decreased breath sounds. She has no rhonchi.  Abdominal: Soft. Normal appearance and bowel sounds are normal. There is no hepatosplenomegaly. There is no tenderness.  Musculoskeletal: Normal range of motion. She exhibits no edema and no tenderness.  Lymphadenopathy:    She has no cervical adenopathy.  Neurological: She is alert and oriented to person, place, and time. No cranial nerve deficit. She exhibits normal muscle tone. Coordination normal.  Skin: Skin is warm and dry. No rash noted. She is not diaphoretic. No erythema. No pallor.  Psychiatric: She has a normal mood and affect. Her behavior is normal. Judgment and thought content normal.          Assessment & Plan:   Problem List Items Addressed This Visit     Unprioritized   Elevated LFTs - Primary     Discussed potential causes of elevated LFTs. Will check for viral hepatitis, Wilson's, autoimmune hepatitis. Encouraged avoidance of alcohol. Will check liver US. Follow up in 2 weeks.    Relevant Orders      Ammonia      Anti-smooth muscle antibody, IgG      Ceruloplasmin      Protime-INR      Hepatitis C antibody      Hepatitis A antibody, IgM      Hepatitis B surface antigen      Hepatitis B surface antibody      CMV IgM      Hepatitis B core antibody, total      AntiMicrosomal Ab-Liver /  Kidney      ANA      US Abdomen Limited RUQ       Return in about 2 weeks (around 11/29/2013) for Recheck.

## 2013-11-16 LAB — CMV IGM: CMV IgM: <8 AU/mL (ref <30.00)

## 2013-11-16 LAB — HEPATITIS B SURFACE ANTIBODY,QUALITATIVE: Hep B S Ab: NEGATIVE

## 2013-11-16 LAB — HEPATITIS B CORE ANTIBODY, TOTAL: Hep B Core Total Ab: NONREACTIVE

## 2013-11-16 LAB — HEPATITIS C ANTIBODY: HCV Ab: NEGATIVE

## 2013-11-16 LAB — HEPATITIS A ANTIBODY, IGM: Hep A IgM: NONREACTIVE

## 2013-11-16 LAB — HEPATITIS B SURFACE ANTIGEN: Hepatitis B Surface Ag: NEGATIVE

## 2013-11-19 LAB — ANTI-SMOOTH MUSCLE ANTIBODY, IGG: SMOOTH MUSCLE AB: 24 U — AB (ref <20)

## 2013-11-19 LAB — ANA: Anti Nuclear Antibody(ANA): NEGATIVE

## 2013-11-19 LAB — CERULOPLASMIN: Ceruloplasmin: 34 mg/dL (ref 18–53)

## 2013-11-21 ENCOUNTER — Encounter: Payer: Self-pay | Admitting: Internal Medicine

## 2013-11-21 DIAGNOSIS — R7989 Other specified abnormal findings of blood chemistry: Secondary | ICD-10-CM

## 2013-11-21 DIAGNOSIS — R945 Abnormal results of liver function studies: Principal | ICD-10-CM

## 2013-11-21 LAB — ANTI-MICROSOMAL ANTIBODY LIVER / KIDNEY

## 2013-11-25 ENCOUNTER — Encounter: Payer: Self-pay | Admitting: Internal Medicine

## 2013-11-26 ENCOUNTER — Encounter: Payer: Self-pay | Admitting: Internal Medicine

## 2013-11-26 ENCOUNTER — Ambulatory Visit: Payer: Self-pay | Admitting: Internal Medicine

## 2013-11-26 ENCOUNTER — Telehealth: Payer: Self-pay | Admitting: Internal Medicine

## 2013-11-26 NOTE — Telephone Encounter (Signed)
Advised pt., Pt has a f/u appt with you this week, she is asking if she needs to keep this appt as well as a f/u with the GI

## 2013-11-26 NOTE — Telephone Encounter (Signed)
Ultrasound of the liver showed fatty infiltration in the liver. I would like for her to follow up with the GI specialist as we discussed.

## 2013-11-26 NOTE — Telephone Encounter (Signed)
Yes, she needs to keep follow up with GI. She can postpone follow up here if she would like, until after GI evaluation complete.

## 2013-11-26 NOTE — Telephone Encounter (Signed)
Pt asked me to cancel her scheduled appt for 11/28/13

## 2013-11-28 ENCOUNTER — Ambulatory Visit: Payer: 59 | Admitting: Internal Medicine

## 2013-12-16 ENCOUNTER — Encounter: Payer: Self-pay | Admitting: Internal Medicine

## 2014-01-27 ENCOUNTER — Ambulatory Visit: Payer: 59 | Admitting: Internal Medicine

## 2014-01-30 ENCOUNTER — Encounter: Payer: Self-pay | Admitting: *Deleted

## 2014-02-04 ENCOUNTER — Other Ambulatory Visit (INDEPENDENT_AMBULATORY_CARE_PROVIDER_SITE_OTHER): Payer: 59

## 2014-02-04 ENCOUNTER — Ambulatory Visit (INDEPENDENT_AMBULATORY_CARE_PROVIDER_SITE_OTHER): Payer: 59 | Admitting: Internal Medicine

## 2014-02-04 ENCOUNTER — Encounter: Payer: Self-pay | Admitting: Internal Medicine

## 2014-02-04 VITALS — BP 142/80 | HR 72 | Ht 61.0 in | Wt 156.0 lb

## 2014-02-04 DIAGNOSIS — R7989 Other specified abnormal findings of blood chemistry: Secondary | ICD-10-CM

## 2014-02-04 DIAGNOSIS — R945 Abnormal results of liver function studies: Principal | ICD-10-CM

## 2014-02-04 DIAGNOSIS — Z23 Encounter for immunization: Secondary | ICD-10-CM

## 2014-02-04 DIAGNOSIS — K7689 Other specified diseases of liver: Secondary | ICD-10-CM

## 2014-02-04 DIAGNOSIS — K760 Fatty (change of) liver, not elsewhere classified: Secondary | ICD-10-CM

## 2014-02-04 LAB — IGA: IgA: 453 mg/dL — ABNORMAL HIGH (ref 68–378)

## 2014-02-04 LAB — IBC PANEL
Iron: 99 ug/dL (ref 42–145)
SATURATION RATIOS: 26.4 % (ref 20.0–50.0)
Transferrin: 268.2 mg/dL (ref 212.0–360.0)

## 2014-02-04 LAB — FERRITIN: Ferritin: 49.2 ng/mL (ref 10.0–291.0)

## 2014-02-04 MED ORDER — HEPATITIS A-HEP B RECOMB VAC 720-20 ELU-MCG/ML IM SUSP: 1.0000 mL | INTRAMUSCULAR | Status: DC

## 2014-02-04 MED ORDER — VITAMIN E 400 UNITS PO TABS: ORAL_TABLET | ORAL | Status: AC

## 2014-02-04 NOTE — Progress Notes (Signed)
[facility]  Shelley Massey    [id number]    [birth year]    Assessment and Plan/Recommendations:  Elevated LFTs: Fluctuant between normal and mildly elevated.  Fatty deposits in liver on [place].  Previous labs for viral hepatitis negative.  Anti smooth muscle antibody weakly positive.  Further testing recommended: IGG, Celiac screen, Alpha 1 antitrypsin, and Iron studies.  Hep B Surface antibody was negative despite pts military history. Recommend vaccination for Hep A and B.  Repeat labs in 3 months, f/u in office in 6 months.   HPI: Shelley Massey is a 48 y.o. female with incidental finding of elevated LFTs on a recent physical screening.  She denies frequent NSAID or tylenol use.  She has never had a blood transfusion and denies IV drug use as well.  She has cut down on moderate drinking since being informed of her elevated liver levels but still consumes 6-8 beers on a weekend.  She is unsure of her family history as she was adopted.  She denies any new medications or any known exposures to hepatitis.  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  Recent US did show fatty deposits in liver. Pt denies N/V, diarrhea, fever, chills, peripheral edema, bloating, blood in stool, easy bruising, jaundice, or fatigue.    Outpatient Encounter Prescriptions as of 02/04/2014  Medication Sig  . amLODipine (NORVASC) 10 MG tablet Take 1 tablet (10 mg total) by mouth daily.  Marland Kitchen aspirin 81 MG tablet Take 81 mg by mouth daily.  Marland Kitchen atenolol (TENORMIN) 25 MG tablet Take 1 tablet (25 mg total) by mouth daily.  . Flaxseed, Linseed, (FLAXSEED OIL) 1000 MG CAPS Take 2 capsules by mouth daily.  . hydrochlorothiazide (HYDRODIURIL) 25 MG tablet Take 1 tablet (25 mg total) by mouth daily.  . Omega-3 Fatty Acids (FISH OIL) 1200 MG CAPS Take 2 capsules by mouth daily.  . Vitamin E 400 UNITS TABS Take 2 tablets by mouth once daily  . [DISCONTINUED]  oxyCODONE-acetaminophen (ROXICET) 5-325 MG per tablet Take 1 tablet by mouth every 4 (four) hours as needed for severe pain.    Allergies as of 02/04/2014  . (No Known Allergies)    Past Medical History  Diagnosis Date  . Hypertension   . Hay fever   . Wears glasses   . Elevated LFTs   . Fatty liver     Past Surgical History  Procedure Laterality Date  . Knee arthroscopy w/ meniscal repair Right 2001    [place], s/p repair x 2  . Carpal tunnel release Bilateral   . Tonsillectomy    . Wisdom tooth extraction    . Mass excision Left 08/12/2013    Procedure: EXCISION VOLAR MASS LEFT WRIST AND THUMB;  Surgeon: Schuyler Amor, MD;  Location: [facility];  Service: Orthopedics;  Laterality: Left;    Family History  Problem Relation Age of Onset  . Adopted: Yes  . Hypertension Sister     History   Social History  . Marital Status: Single    Spouse Name: N/A    Number of Children: 0  . Years of Education: N/A   Occupational History  . SUPERVISOR     Social History Main Topics  . Smoking status: Never Smoker   . Smokeless tobacco: Never Used  . Alcohol Use: 4.2 - 6.0 oz/week    6-8 Cans of beer, 1-2 Shots of liquor per week  Comment: Mostly on the weekends.  . Drug Use: No  . Sexual Activity: Not on file   Other Topics Concern  . Not on file   Social History Narrative   Lives in [place].      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   Exercise - walking   Diet - healthy      Review of systems: All other ROS negative or as per HPI  Physical Exam: BP 142/80  Pulse 72  Ht 5\' 1"  (1.549 m)  Wt 156 lb (70.761 kg)  BMI 29.49 kg/m2  LMP 01/26/2014 Constitutional: WDWN in NAD Eyes: anicteric Mouth: oral and posterior pharynx free of lesions Neck: supple, no mass or thyromegaly Lungs: clear to auscultation bilaterally Cardiovascular: S1S2 with regular rate and rhythm, no rubs murmurs or gallops Abdomen:  soft, nontender, nondistended, no masses or organomegaly, normal bowel sounds Extremities: no lower extremity edema  Skin: no rash Neuro: alert and oriented x 3 Psych: normal mood and affect  Data Reviewed: Previous labs and imaging  Legrand Como A. [place], [place] 02/04/2014 11:02 AM   Addendum:  agree with the above documentation, including the assessment and plan as documented by Barrington Ellison --Patient with mild elevation in serum transaminases and fatty liver by ultrasound. She was previously drinking alcohol as many as 4-8 beers on the weekend. This certainly could have contributed to fatty liver. She has reduced alcohol intake, and I have recommended that she try to minimize all alcohol. If she does drink I have advised no more than 2 drinks on any given day. Smooth muscle antibody was weakly positive, though ANA negative. This is unlikely autoimmune hepatitis, but I would like to check an IgG. Iron studies, alpha 1 antitrypsin for completeness. Overall this is very likely fatty liver disease. She subsequently had an insurance physical and her AST and ALT have normalized. This was after attempts to reduce alcohol and lose weight. I will start her on vitamin E 800 international units daily for fatty liver disease. She is already working on improved diet and increased exercise. Repeat liver enzymes in 3 months in office followup in 6 months. We're vaccinating against hepatitis A and B.  Labs dated 12/24/2013 -- AST 22, ALT 30, alkaline phosphatase 54, total bilirubin 0.5, GGT 31 (all normal)

## 2014-02-04 NOTE — Patient Instructions (Signed)
Your physician has requested that you go to the basement for the following lab work before leaving today: IgG, IBC, Ferritin, Celiac studies, Alpha 1 antitrypsin  We have sent the following medications to your pharmacy for you to pick up at your convenience: Vitamin E 400 mg-Take 2 tablets by mouth once daily  We have started your Twinrix standard series. Your next injection is due on Tuesday, 03/04/14 @ 9:00 am  CC:Dr Ronette Deter

## 2014-02-05 LAB — TISSUE TRANSGLUTAMINASE, IGA: Tissue Transglutaminase Ab, IgA: 7.7 U/mL (ref <20)

## 2014-02-05 LAB — IGG: IgG (Immunoglobin G), Serum: 1580 mg/dL (ref 690–1700)

## 2014-02-06 LAB — ALPHA-1-ANTITRYPSIN: A1 ANTITRYPSIN SER: 114 mg/dL (ref 83–199)

## 2014-03-04 ENCOUNTER — Ambulatory Visit (INDEPENDENT_AMBULATORY_CARE_PROVIDER_SITE_OTHER): Payer: 59 | Admitting: Internal Medicine

## 2014-03-04 DIAGNOSIS — Z23 Encounter for immunization: Secondary | ICD-10-CM

## 2014-05-09 ENCOUNTER — Other Ambulatory Visit: Payer: Self-pay | Admitting: Internal Medicine

## 2014-05-09 NOTE — Telephone Encounter (Signed)
Appt 05/16/14

## 2014-05-16 ENCOUNTER — Encounter: Payer: 59 | Admitting: Internal Medicine

## 2014-07-22 ENCOUNTER — Other Ambulatory Visit: Payer: Self-pay | Admitting: Internal Medicine

## 2014-07-22 NOTE — Telephone Encounter (Signed)
Last OV 5.22.15.  Please advise refill

## 2014-08-14 ENCOUNTER — Telehealth: Payer: Self-pay

## 2014-08-14 NOTE — Telephone Encounter (Signed)
-----   Message from Audrea Muscat, [place] sent at 03/04/2014  2:38 PM EDT ----- Patient due for 3rd/final twinrix (standard) due 09/01/2013.  Please call to schedule appointment.

## 2014-08-14 NOTE — Telephone Encounter (Signed)
Pt scheduled for Twinrix #3 09/02/14@2 :45pm. Pt aware of appt.

## 2014-09-02 ENCOUNTER — Ambulatory Visit (INDEPENDENT_AMBULATORY_CARE_PROVIDER_SITE_OTHER): Payer: 59 | Admitting: Internal Medicine

## 2014-09-02 ENCOUNTER — Other Ambulatory Visit: Payer: Self-pay

## 2014-09-02 DIAGNOSIS — Z23 Encounter for immunization: Secondary | ICD-10-CM

## 2014-09-02 DIAGNOSIS — R7989 Other specified abnormal findings of blood chemistry: Secondary | ICD-10-CM

## 2014-09-02 DIAGNOSIS — R945 Abnormal results of liver function studies: Principal | ICD-10-CM

## 2014-11-06 ENCOUNTER — Other Ambulatory Visit: Payer: Self-pay | Admitting: Internal Medicine

## 2014-11-25 ENCOUNTER — Other Ambulatory Visit: Payer: Self-pay | Admitting: Internal Medicine

## 2014-11-26 MED ORDER — ATENOLOL 25 MG PO TABS: 25.0000 mg | ORAL_TABLET | Freq: Every day | ORAL | Status: DC

## 2014-12-08 ENCOUNTER — Other Ambulatory Visit: Payer: Self-pay | Admitting: Internal Medicine

## 2014-12-09 ENCOUNTER — Encounter: Payer: Self-pay | Admitting: Internal Medicine

## 2014-12-09 ENCOUNTER — Ambulatory Visit (INDEPENDENT_AMBULATORY_CARE_PROVIDER_SITE_OTHER): Payer: 59 | Admitting: Internal Medicine

## 2014-12-09 ENCOUNTER — Other Ambulatory Visit: Payer: Self-pay | Admitting: *Deleted

## 2014-12-09 VITALS — BP 132/84 | HR 74 | Temp 98.1°F | Ht 61.75 in | Wt 164.5 lb

## 2014-12-09 DIAGNOSIS — Z6830 Body mass index (BMI) 30.0-30.9, adult: Secondary | ICD-10-CM | POA: Diagnosis not present

## 2014-12-09 DIAGNOSIS — E669 Obesity, unspecified: Secondary | ICD-10-CM | POA: Insufficient documentation

## 2014-12-09 DIAGNOSIS — Z0000 Encounter for general adult medical examination without abnormal findings: Secondary | ICD-10-CM

## 2014-12-09 MED ORDER — ATENOLOL 25 MG PO TABS: 25.0000 mg | ORAL_TABLET | Freq: Every day | ORAL | Status: DC

## 2014-12-09 NOTE — Assessment & Plan Note (Signed)
General medical exam normal today. Breast and pelvic exam deferred as completed by pt physician at [facility]. Mammogram UTD. Immunizations UTD. Fasting labs as ordered. Encouraged healthy diet and exercise.

## 2014-12-09 NOTE — Progress Notes (Signed)
Pre visit review using our clinic review tool, if applicable. No additional management support is needed unless otherwise documented below in the visit note. 

## 2014-12-09 NOTE — Progress Notes (Signed)
Subjective:    Patient ID: Shelley Massey, female    DOB: [birth year], 49 y.o.   MRN: [id number]  HPI  49YO female presents for annual exam.  Exercising at [employer]. Periods have been lighter, more irregular. No hot flashes, vaginal dryness.  Generally feeling well.  Wt Readings from Last 3 Encounters:  12/09/14 164 lb 8 oz (74.617 kg)  02/04/14 156 lb (70.761 kg)  11/15/13 161 lb 8 oz (73.256 kg)     Past medical, surgical, family and social history per today's encounter.  Review of Systems  Constitutional: Negative for fever, chills, appetite change, fatigue and unexpected weight change.  Eyes: Negative for visual disturbance.  Respiratory: Negative for shortness of breath.   Cardiovascular: Negative for chest pain and leg swelling.  Gastrointestinal: Negative for nausea, vomiting, abdominal pain, diarrhea and constipation.  Skin: Negative for color change and rash.  Hematological: Negative for adenopathy. Does not bruise/bleed easily.  Psychiatric/Behavioral: Negative for sleep disturbance and dysphoric mood. The patient is not nervous/anxious.        Objective:    BP 132/84 mmHg  Pulse 74  Temp(Src) 98.1 F (36.7 C) (Oral)  Ht 5' 1.75" (1.568 m)  Wt 164 lb 8 oz (74.617 kg)  BMI 30.35 kg/m2  SpO2 97%  LMP 12/05/2014 Physical Exam  Constitutional: She is oriented to person, place, and time. She appears well-developed and well-nourished. No distress.  HENT:  Head: Normocephalic and atraumatic.  Right Ear: External ear normal.  Left Ear: External ear normal.  Nose: Nose normal.  Mouth/Throat: Oropharynx is clear and moist. No oropharyngeal exudate.  Eyes: Conjunctivae and EOM are normal. Pupils are equal, round, and reactive to light. Right eye exhibits no discharge.  Neck: Normal range of motion. Neck supple. No thyromegaly present.  Cardiovascular: Normal rate, regular rhythm, normal heart sounds and intact distal pulses.  Exam reveals no gallop and  no friction rub.   No murmur heard. Pulmonary/Chest: Effort normal. No respiratory distress. She has no wheezes. She has no rales.  Abdominal: Soft. Bowel sounds are normal. She exhibits no distension and no mass. There is no tenderness. There is no rebound and no guarding.  Musculoskeletal: Normal range of motion. She exhibits no edema or tenderness.  Lymphadenopathy:    She has no cervical adenopathy.  Neurological: She is alert and oriented to person, place, and time. No cranial nerve deficit. Coordination normal.  Skin: Skin is warm and dry. No rash noted. She is not diaphoretic. No erythema. No pallor.  Psychiatric: She has a normal mood and affect. Her behavior is normal. Judgment and thought content normal.          Assessment & Plan:   Problem List Items Addressed This Visit      Unprioritized   BMI 30.0-30.9,adult    Wt Readings from Last 3 Encounters:  12/09/14 164 lb 8 oz (74.617 kg)  02/04/14 156 lb (70.761 kg)  11/15/13 161 lb 8 oz (73.256 kg)   Encouraged healthy diet and exercise.      Routine general medical examination at a health care facility - Primary    General medical exam normal today. Breast and pelvic exam deferred as completed by pt physician at [facility]. Mammogram UTD. Immunizations UTD. Fasting labs as ordered. Encouraged healthy diet and exercise.      Relevant Orders   CBC with Differential/Platelet   Comprehensive metabolic panel   Lipid panel   Microalbumin / creatinine urine ratio   Vit D  25 hydroxy (rtn osteoporosis monitoring)   TSH       Return in about 1 year (around 12/09/2015) for Physical.

## 2014-12-09 NOTE — Patient Instructions (Signed)

## 2014-12-09 NOTE — Assessment & Plan Note (Signed)
Wt Readings from Last 3 Encounters:  12/09/14 164 lb 8 oz (74.617 kg)  02/04/14 156 lb (70.761 kg)  11/15/13 161 lb 8 oz (73.256 kg)   Encouraged healthy diet and exercise.

## 2014-12-19 ENCOUNTER — Other Ambulatory Visit: Payer: 59

## 2014-12-22 ENCOUNTER — Other Ambulatory Visit (INDEPENDENT_AMBULATORY_CARE_PROVIDER_SITE_OTHER): Payer: 59

## 2014-12-22 DIAGNOSIS — Z0000 Encounter for general adult medical examination without abnormal findings: Secondary | ICD-10-CM | POA: Diagnosis not present

## 2014-12-22 LAB — MICROALBUMIN / CREATININE URINE RATIO
CREATININE, U: 181.1 mg/dL
Microalb Creat Ratio: 1.5 mg/g (ref 0.0–30.0)
Microalb, Ur: 2.7 mg/dL — ABNORMAL HIGH (ref 0.0–1.9)

## 2014-12-22 LAB — COMPREHENSIVE METABOLIC PANEL
ALK PHOS: 59 U/L (ref 39–117)
ALT: 43 U/L — ABNORMAL HIGH (ref 0–35)
AST: 35 U/L (ref 0–37)
Albumin: 4.4 g/dL (ref 3.5–5.2)
BILIRUBIN TOTAL: 0.7 mg/dL (ref 0.2–1.2)
BUN: 10 mg/dL (ref 6–23)
CO2: 26 mEq/L (ref 19–32)
CREATININE: 0.59 mg/dL (ref 0.40–1.20)
Calcium: 9.4 mg/dL (ref 8.4–10.5)
Chloride: 100 mEq/L (ref 96–112)
GFR: 116.35 mL/min (ref >60.00)
Glucose, Bld: 91 mg/dL (ref 70–99)
Potassium: 3.9 mEq/L (ref 3.5–5.1)
Sodium: 138 mEq/L (ref 135–145)
Total Protein: 7.9 g/dL (ref 6.0–8.3)

## 2014-12-22 LAB — CBC WITH DIFFERENTIAL/PLATELET
BASOS ABS: 0 10*3/uL (ref 0.0–0.1)
Basophils Relative: 0.5 % (ref 0.0–3.0)
EOS PCT: 2.3 % (ref 0.0–5.0)
Eosinophils Absolute: 0.1 10*3/uL (ref 0.0–0.7)
HCT: 43 % (ref 36.0–46.0)
Hemoglobin: 14.8 g/dL (ref 12.0–15.0)
Lymphocytes Relative: 27.4 % (ref 12.0–46.0)
Lymphs Abs: 1.2 10*3/uL (ref 0.7–4.0)
MCHC: 34.4 g/dL (ref 30.0–36.0)
MCV: 93.6 fl (ref 78.0–100.0)
Monocytes Absolute: 0.4 10*3/uL (ref 0.1–1.0)
Monocytes Relative: 9.6 % (ref 3.0–12.0)
NEUTROS PCT: 60.2 % (ref 43.0–77.0)
Neutro Abs: 2.7 10*3/uL (ref 1.4–7.7)
PLATELETS: 348 10*3/uL (ref 150.0–400.0)
RBC: 4.6 Mil/uL (ref 3.87–5.11)
RDW: 12.4 % (ref 11.5–15.5)
WBC: 4.5 10*3/uL (ref 4.0–10.5)

## 2014-12-22 LAB — LIPID PANEL
CHOLESTEROL: 245 mg/dL — AB (ref 0–200)
HDL: 50 mg/dL (ref >39.00)
LDL Cholesterol: 157 mg/dL — ABNORMAL HIGH (ref 0–99)
NONHDL: 195
TRIGLYCERIDES: 191 mg/dL — AB (ref 0.0–149.0)
Total CHOL/HDL Ratio: 5
VLDL: 38.2 mg/dL (ref 0.0–40.0)

## 2014-12-22 LAB — TSH: TSH: 4.26 u[IU]/mL (ref 0.35–4.50)

## 2014-12-22 LAB — VITAMIN D 25 HYDROXY (VIT D DEFICIENCY, FRACTURES): VITD: 16.66 ng/mL — AB (ref 30.00–100.00)

## 2015-01-05 ENCOUNTER — Other Ambulatory Visit: Payer: Self-pay

## 2015-01-05 ENCOUNTER — Encounter: Payer: Self-pay | Admitting: Emergency Medicine

## 2015-01-05 ENCOUNTER — Emergency Department
Admission: EM | Admit: 2015-01-05 | Discharge: 2015-01-05 | Disposition: A | Discharge status: Home / Self Care | Payer: 59 | Attending: Emergency Medicine | Admitting: Emergency Medicine

## 2015-01-05 DIAGNOSIS — Y9389 Activity, other specified: Secondary | ICD-10-CM | POA: Diagnosis not present

## 2015-01-05 DIAGNOSIS — F419 Anxiety disorder, unspecified: Secondary | ICD-10-CM | POA: Diagnosis not present

## 2015-01-05 DIAGNOSIS — T675XXA Heat exhaustion, unspecified, initial encounter: Secondary | ICD-10-CM | POA: Insufficient documentation

## 2015-01-05 DIAGNOSIS — Y9289 Other specified places as the place of occurrence of the external cause: Secondary | ICD-10-CM | POA: Insufficient documentation

## 2015-01-05 DIAGNOSIS — Z7982 Long term (current) use of aspirin: Secondary | ICD-10-CM | POA: Insufficient documentation

## 2015-01-05 DIAGNOSIS — Z79899 Other long term (current) drug therapy: Secondary | ICD-10-CM | POA: Diagnosis not present

## 2015-01-05 DIAGNOSIS — E860 Dehydration: Secondary | ICD-10-CM | POA: Diagnosis present

## 2015-01-05 DIAGNOSIS — X30XXXA Exposure to excessive natural heat, initial encounter: Secondary | ICD-10-CM | POA: Insufficient documentation

## 2015-01-05 DIAGNOSIS — I10 Essential (primary) hypertension: Secondary | ICD-10-CM | POA: Insufficient documentation

## 2015-01-05 LAB — BASIC METABOLIC PANEL
Anion gap: 14 (ref 5–15)
BUN: 12 mg/dL (ref 6–20)
CO2: 25 mmol/L (ref 22–32)
CREATININE: 0.71 mg/dL (ref 0.44–1.00)
Calcium: 9.8 mg/dL (ref 8.9–10.3)
Chloride: 93 mmol/L — ABNORMAL LOW (ref 101–111)
GFR calc Af Amer: >60 mL/min (ref >60)
Glucose, Bld: 122 mg/dL — ABNORMAL HIGH (ref 65–99)
Potassium: 3 mmol/L — ABNORMAL LOW (ref 3.5–5.1)
Sodium: 132 mmol/L — ABNORMAL LOW (ref 135–145)

## 2015-01-05 LAB — URINALYSIS COMPLETE WITH MICROSCOPIC (ARMC ONLY)
Bilirubin Urine: NEGATIVE
Glucose, UA: NEGATIVE mg/dL
HGB URINE DIPSTICK: NEGATIVE
Leukocytes, UA: NEGATIVE
Nitrite: NEGATIVE
Protein, ur: 30 mg/dL — AB
Specific Gravity, Urine: 1.016 (ref 1.005–1.030)
pH: 6 (ref 5.0–8.0)

## 2015-01-05 LAB — CK: Total CK: 117 U/L (ref 38–234)

## 2015-01-05 LAB — CBC
HCT: 44.9 % (ref 35.0–47.0)
HEMOGLOBIN: 15.6 g/dL (ref 12.0–16.0)
MCH: 32.4 pg (ref 26.0–34.0)
MCHC: 34.7 g/dL (ref 32.0–36.0)
MCV: 93.5 fL (ref 80.0–100.0)
Platelets: 272 10*3/uL (ref 150–440)
RBC: 4.81 MIL/uL (ref 3.80–5.20)
RDW: 12.5 % (ref 11.5–14.5)
WBC: 7.9 10*3/uL (ref 3.6–11.0)

## 2015-01-05 LAB — TROPONIN I: Troponin I: <0.03 ng/mL (ref <0.031)

## 2015-01-05 MED ORDER — SODIUM CHLORIDE 0.9 % IV SOLN
1000.0000 mL | Freq: Once | INTRAVENOUS | Status: AC
  Administered 2015-01-05: 1000 mL via INTRAVENOUS

## 2015-01-05 MED ORDER — ONDANSETRON HCL 4 MG/2ML IJ SOLN
INTRAMUSCULAR | Status: AC
Start: 2015-01-05 — End: 2015-01-05
  Administered 2015-01-05: 4 mg via INTRAVENOUS
  Filled 2015-01-05: qty 2

## 2015-01-05 MED ORDER — ONDANSETRON HCL 4 MG/2ML IJ SOLN
4.0000 mg | Freq: Once | INTRAMUSCULAR | Status: AC
  Administered 2015-01-05: 4 mg via INTRAVENOUS

## 2015-01-05 NOTE — ED Notes (Signed)
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

## 2015-01-05 NOTE — ED Provider Notes (Signed)
[facility] Emergency Department Provider Note  ____________________________________________  Time seen: On arrival  I have reviewed the triage vital signs and the nursing notes.   HISTORY  Chief Complaint Dehydration   HPI Shelley Massey is a 49 y.o. female who presents with dizziness and nausea after working outside in the heat. She reports she was feeling well this morning but decided to go work in the garden in the 90 heat with significant humidity. She knew that she had overdone it after she came inside and felt dizzy and nauseous. She denies chest pain, no shortness of breath.     Past Medical History  Diagnosis Date  . Hypertension   . Hay fever   . Wears glasses   . Elevated LFTs   . Fatty liver     Patient Active Problem List   Diagnosis Date Noted  . Routine general medical examination at a health care facility 12/09/2014  . BMI 30.0-30.9,adult 12/09/2014  . Elevated LFTs 11/15/2013  . Allergic rhinitis 11/08/2013  . Hypertension 02/22/2012  . Screening for breast cancer 02/22/2012  . Hyperlipidemia LDL goal < 100 02/22/2012    Past Surgical History  Procedure Laterality Date  . Knee arthroscopy w/ meniscal repair Right 2001    [place], s/p repair x 2  . Carpal tunnel release Bilateral   . Tonsillectomy    . Wisdom tooth extraction    . Mass excision Left 08/12/2013    Procedure: EXCISION VOLAR MASS LEFT WRIST AND THUMB;  Surgeon: Schuyler Amor, MD;  Location: [facility];  Service: Orthopedics;  Laterality: Left;    Current Outpatient Rx  Name  Route  Sig  Dispense  Refill  . amLODipine (NORVASC) 10 MG tablet      Take 1 tablet (10 mg total) by mouth daily.   90 tablet   3   . aspirin 81 MG tablet   Oral   Take 81 mg by mouth daily.         Marland Kitchen atenolol (TENORMIN) 25 MG tablet   Oral   Take 1 tablet (25 mg total) by mouth daily.   90 tablet   1   . Flaxseed, Linseed, (FLAXSEED OIL) 1000 MG CAPS   Oral   Take 2 capsules by mouth daily.         . hydrochlorothiazide (HYDRODIURIL) 25 MG tablet      Take 1 tablet (25 mg total) by mouth daily.   90 tablet   3   . Omega-3 Fatty Acids (FISH OIL) 1200 MG CAPS   Oral   Take 2 capsules by mouth daily.         . Vitamin E 400 UNITS TABS      Take 2 tablets by mouth once daily   60 tablet   2     Allergies Review of patient's allergies indicates no known allergies.  Family History  Problem Relation Age of Onset  . Adopted: Yes  . Hypertension Sister     Social History History  Substance Use Topics  . Smoking status: Never Smoker   . Smokeless tobacco: Never Used  . Alcohol Use: 4.2 - 6.0 oz/week    6-8 Cans of beer, 1-2 Shots of liquor per week     Comment: Mostly on the weekends.    Review of Systems  Constitutional: Negative for fever. Eyes: Negative for visual changes. ENT: Negative for sore throat Cardiovascular: Negative for chest pain. Respiratory: Negative for shortness of  breath. Gastrointestinal: Negative for abdominal pain, vomiting and diarrhea. Genitourinary: Negative for dysuria. Musculoskeletal: Negative for back pain. Skin: Negative for rash. Neurological: Negative for headaches or focal weakness. Positive for dizziness Psychiatric: Positive for anxiety  10-point ROS otherwise negative.  ____________________________________________   PHYSICAL EXAM:  VITAL SIGNS: ED Triage Vitals  Enc Vitals Group     BP 01/05/15 1617 182/104 mmHg     Pulse Rate 01/05/15 1617 106     Resp 01/05/15 1617 20     Temp 01/05/15 1617 98.1 F (36.7 C)     Temp Source 01/05/15 1617 Oral     SpO2 01/05/15 1617 100 %     Weight 01/05/15 1619 160 lb (72.576 kg)     Height 01/05/15 1619 5\' 1"  (1.549 m)     Head Cir --      Peak Flow --      Pain Score --      Pain Loc --      Pain Edu? --      Excl. in [place]? --      Constitutional: Alert and oriented. Well appearing and in no distress. Eyes:  Conjunctivae are normal.  ENT   Head: Normocephalic and atraumatic.   Mouth/Throat: Mucous membranes are moist. Cardiovascular: Normal rate, regular rhythm. Normal and symmetric distal pulses are present in all extremities. No murmurs, rubs, or gallops. Respiratory: Normal respiratory effort without tachypnea nor retractions. Breath sounds are clear and equal bilaterally.  Gastrointestinal: Soft and non-tender in all quadrants. No distention. There is no CVA tenderness. Genitourinary: deferred Musculoskeletal: Nontender with normal range of motion in all extremities. No lower extremity tenderness nor edema. Neurologic:  Normal speech and language. No gross focal neurologic deficits are appreciated. Skin:  Skin is warm, dry and intact. No rash noted. Psychiatric: Mood and affect are normal. Patient exhibits appropriate insight and judgment.  ____________________________________________    LABS (pertinent positives/negatives)  Labs Reviewed  URINALYSIS COMPLETEWITH MICROSCOPIC ([place] ONLY) - Abnormal; Notable for the following:    Color, Urine YELLOW (*)    APPearance CLEAR (*)    Ketones, ur TRACE (*)    Protein, ur 30 (*)    Bacteria, UA RARE (*)    Squamous Epithelial / LPF 0-5 (*)    All other components within normal limits  CBC  BASIC METABOLIC PANEL  CK  TROPONIN I    ____________________________________________   EKG  ED ECG REPORT I, Lavonia Drafts, the attending physician, personally viewed and interpreted this ECG.  Date: 01/05/2015 EKG Time: 5:41 PM Rate: 90 Rhythm: normal sinus rhythm QRS Axis: normal Intervals: normal ST/T Wave abnormalities: normal Conduction Disutrbances: none Narrative Interpretation: unremarkable   ____________________________________________    RADIOLOGY I have personally reviewed any xrays that were ordered on this patient: None  ____________________________________________   PROCEDURES  Procedure(s) performed:  none  Critical Care performed: none  ____________________________________________   INITIAL IMPRESSION / ASSESSMENT AND PLAN / ED COURSE  Pertinent labs & imaging results that were available during my care of the patient were reviewed by me and considered in my medical decision making (see chart for details).  Suspect heat exhaustion. No chest pain no syncope. We'll check labs, and give normal saline IV 1 L. With nausea medication. Reevaluate ----------------------------------------- 6:44 PM on 01/05/2015 -----------------------------------------  Patient feels significant better. She says she is tired and ready to go home. Her heart rate is 92 ____________________________________________   FINAL CLINICAL IMPRESSION(S) / ED DIAGNOSES  Final diagnoses:  Heat exhaustion,  initial encounter     Lavonia Drafts, MD 01/05/15 1845

## 2015-01-05 NOTE — Discharge Instructions (Signed)
Heat-Related Illness °Heat-related illnesses occur when the body is unable to properly cool itself. The body normally cools itself by sweating. However, under some conditions sweating is not enough. In these cases, a person's body temperature rises rapidly. Very high body temperatures may damage the brain or other vital organs. Some examples of heat-related illnesses include: °· Heat stroke. This occurs when the body is unable to regulate its temperature. The body's temperature rises rapidly, the sweating mechanism fails, and the body is unable to cool down. Body temperature may rise to 106° F (41° C) or higher within 10 to 15 minutes. Heat stroke can cause death or permanent disability if emergency treatment is not provided. °· Heat exhaustion. This is a milder form of heat-related illness that can develop after several days of exposure to high temperatures and not enough fluids. It is the body's response to an excessive loss of the water and salt contained in sweat. °· Heat cramps. These usually affect people who sweat a lot during heavy activity. This sweating drains the body's salt and moisture. The low salt level in the muscles causes painful cramps. Heat cramps may also be a symptom of heat exhaustion. Heat cramps usually occur in the abdomen, arms, or legs. Get medical attention for cramps if you have heart problems or are on a low-sodium diet. °Those that are at greatest risk for heat-related illnesses include:  °· The elderly. °· Infant and the very young. °· People with mental illness and chronic diseases. °· People who are overweight (obese). °· Young and healthy people can even succumb to heat if they participate in strenuous physical activities during hot weather. °CAUSES  °Several factors affect the body's ability to cool itself during extremely hot weather. When the humidity is high, sweat will not evaporate as quickly. This prevents the body from releasing heat quickly. Other factors that can affect  the body's ability to cool down include:  °· Age. °· Obesity. °· Fever. °· Dehydration. °· Heart disease. °· Mental illness. °· Poor circulation. °· Sunburn. °· Prescription drug use. °· Alcohol use. °SYMPTOMS  °Heat stroke: Warning signs of heat stroke vary, but may include: °· An extremely high body temperature (above 103°F orally). °· A fast, strong pulse. °· Dizziness. °· Confusion. °· Red, hot, and dry skin. °· No sweating. °· Throbbing headache. °· Feeling sick to your stomach (nauseous). °· Unconsciousness. °Heat exhaustion: Warning signs of heat exhaustion include: °· Heavy sweating. °· Tiredness. °· Headache. °· Paleness. °· Weakness. °· Feeling sick to your stomach (nauseous) or vomiting. °· Muscle cramps. °Heat cramps °· Muscle pains or spasms. °TREATMENT  °Heat stroke °· Get into a cool environment. An indoor place that is air-conditioned may be best. °· Take a cool shower or bath. Have someone around to make sure you are okay. °· Take your temperature. Make sure it is going down. °Heat exhaustion °· Drink plenty of fluids. Do not drink liquids that contain caffeine, alcohol, or large amounts of sugar. These cause you to lose more body fluid. Also, avoid very cold drinks. They can cause stomach cramps. °· Get into a cool environment. An indoor place that is air-conditioned may be best. °· Take a cool shower or bath. Have someone around to make sure you are okay. °· Put on lightweight clothing. °Heat cramps °· Stop whatever activity you were doing. Do not attempt to do that activity for at least 3 hours after the cramps have gone away. °· Get into a cool environment. An indoor   place that is air-conditioned may be best. °HOME CARE INSTRUCTIONS  °To protect your health when temperatures are extremely high, follow these tips: °· During heavy exercise in a hot environment, drink two to four glasses (16-32 ounces) of cool fluids each hour. Do not wait until you are thirsty to drink. Warning: If your caregiver  limits the amount of fluid you drink or has you on water pills, ask how much you should drink while the weather is hot. °· Do not drink liquids that contain caffeine, alcohol, or large amounts of sugar. These cause you to lose more body fluid. °· Avoid very cold drinks. They can cause stomach cramps. °· Wear appropriate clothing. Choose lightweight, light-colored, loose-fitting clothing. °· If you must be outdoors, try to limit your outdoor activity to morning and evening hours. Try to rest often in shady areas. °· If you are not used to working or exercising in a hot environment, start slowly and pick up the pace gradually. °· Stay cool in an air-conditioned place if possible. If your home does not have air conditioning, go to the shopping mall or public library. °· Taking a cool shower or bath may help you cool off. °SEEK MEDICAL CARE IF:  °· You see any of the symptoms listed above. You may be dealing with a life-threatening emergency. °· Symptoms worsen or last longer than 1 hour. °· Heat cramps do not get better in 1 hour. °MAKE SURE YOU:  °· Understand these instructions. °· Will watch your condition. °· Will get help right away if you are not doing well or get worse. °Document Released: 03/22/2008 Document Revised: 09/05/2011 Document Reviewed: 03/22/2008 °ExitCare® Patient Information ©2015 [employer]. This information is not intended to replace advice given to you by your health care provider. Make sure you discuss any questions you have with your health care provider. ° °

## 2015-02-16 ENCOUNTER — Ambulatory Visit (INDEPENDENT_AMBULATORY_CARE_PROVIDER_SITE_OTHER): Payer: 59 | Admitting: Nurse Practitioner

## 2015-02-16 ENCOUNTER — Encounter: Payer: Self-pay | Admitting: Nurse Practitioner

## 2015-02-16 VITALS — BP 122/80 | HR 71 | Temp 97.8°F | Resp 14 | Ht 61.75 in | Wt 157.0 lb

## 2015-02-16 DIAGNOSIS — N912 Amenorrhea, unspecified: Secondary | ICD-10-CM | POA: Diagnosis not present

## 2015-02-16 MED ORDER — ESCITALOPRAM OXALATE 10 MG PO TABS: 10.0000 mg | ORAL_TABLET | Freq: Every day | ORAL | Status: DC

## 2015-02-16 NOTE — Patient Instructions (Signed)
Menopause and Herbal Products Menopause is the normal time of life when menstrual periods stop completely. Menopause is complete when you have missed 12 consecutive menstrual periods. It usually occurs between the ages of 48 to 55, with an average age of 51. Very rarely does a woman develop menopause before 47 years old. At menopause, your ovaries stop producing the female hormones, estrogen and progesterone. This can cause undesirable symptoms and also affect your health. Sometimes the symptoms can occur 4 to 5 years before the menopause begins. There is no relationship between menopause and:  Oral contraceptives.  Number of children you had.  Race.  The age your menstrual periods started (menarche). Heavy smokers and very thin women may develop menopause earlier in life. Estrogen and progesterone hormone treatment is the usual method of treating menopausal symptoms. However, there are women who should not take hormone treatment. This is true of:   Women that have breast or uterine cancer.  Women who prefer not to take hormones because of certain side effects (abnormal uterine bleeding).  Women who are afraid that hormones may cause breast cancer.  Women who have a history of liver disease, heart disease, stroke, or blood clots. For these women, there are other medications that may help treat their menopausal symptoms. These medications are found in plants and botanical products. They can be found in the form of herbs, teas, oils, tinctures, and pills.  CAUSES:  The ovaries stop producing the female hormones estrogen and progesterone.  Other causes include:  Surgery to remove both ovaries.  The ovaries stop functioning for no know reason.  Tumors of the pituitary gland in the brain.  Medical disease that affects the ovaries and hormone production.  Radiation treatment to the abdomen or pelvis.  Chemotherapy that affects the ovaries. PHYTOESTROGENS: Phytoestrogens occur  naturally in plants and plant products. They act like estrogen in the body. Herbal medications are made from these plants and botanical steroids. There are 3 types of phytoestrogens:  Isoflavones (genistein and daidzein) are found in soy, garbanzo beans, miso and tofu foods.  Ligins are found in the shell of seeds. They are used to make oils like flaxseed oil. The bacteria in your intestine act on these foods to produce the estrogen-like hormones.  Coumestans are estrogen-like. Some of the foods they are found in include sunflower seeds and bean sprouts. CONDITIONS AND THEIR POSSIBLE HERBAL TREATMENT:  Hot flashes and night sweats.  Soy, black cohosh and evening primrose.  Irritability, insomnia, depression and memory problems.  Chasteberry, ginseng, and soy.  St. John's wort may be helpful for depression. However, there is a concern of it causing cataracts of the eye and may have bad effects on other medications. St. John's wort should not be taken for long time and without your caregiver's advice.  Loss of libido and vaginal and skin dryness.  Wild yam and soy.  Prevention of coronary heart disease and osteoporosis.  Soy and Isoflavones. Several studies have shown that some women benefit from herbal medications, but most of the studies have not consistently shown that these supplements are much better than placebo. Other forms of treatment to help women with menopausal symptoms include a balanced diet, rest, exercise, vitamin and calcium (with vitamin D) supplements, acupuncture, and group therapy when necessary. THOSE WHO SHOULD NOT TAKE HERBAL MEDICATIONS INCLUDE:  Women who are planning on getting pregnant unless told by your caregiver.  Women who are breastfeeding unless told by your caregiver.  Women who are taking other   prescription medications unless told by your caregiver.  Infants, children, and elderly women unless told by your caregiver. Different herbal medications  have different and unmeasured amounts of the herbal ingredients. There are no regulations, quality control, and standardization of the ingredients in herbal medications. Therefore, the amount of the ingredient in the medication may vary from one herb, pill, tea, oil or tincture to another. Many herbal medications can cause serious problems and can even have poisonous effects if taken too much or too long. If problems develop, the medication should be stopped and recorded by your caregiver. HOME CARE INSTRUCTIONS  Do not take or give children herbal medications without your caregiver's advice.  Let your caregiver know all the medications you are taking. This includes prescription, over-the-counter, eye drops, and creams.  Do not take herbal medications longer or more than recommended.  Tell your caregiver about any side effects from the medication. SEEK MEDICAL CARE IF:  You develop a fever of 102 F (38.9 C), or as directed by your caregiver.  You feel sick to your stomach (nauseous), vomit, or have diarrhea.  You develop a rash.  You develop abdominal pain.  You develop severe headaches.  You start to have vision problems.  You feel dizzy or faint.  You start to feel numbness in any part of your body.  You start shaking (have convulsions). Document Released: 11/30/2007 Document Revised: 05/30/2012 Document Reviewed: 06/29/2010 ExitCare Patient Information 2015 [employer]. This information is not intended to replace advice given to you by your health care provider. Make sure you discuss any questions you have with your health care provider.  

## 2015-02-16 NOTE — Progress Notes (Signed)
Patient ID: Shelley Massey, female    DOB: 05-25-1966  Age: 49 y.o. MRN: [id number]  CC: Amenorrhea   HPI [name] presents for amenorrhea 1 month and half.  1) Irregular periods, missed 1 month and a half,  Hot flashes- day time only  Denies vaginal dryness   Taking melatonin at night   History Shelley Massey has a past medical history of Hypertension; Hay fever; Wears glasses; Elevated LFTs; and Fatty liver.   She has past surgical history that includes Knee arthroscopy w/ meniscal repair (Right, 2001); Carpal tunnel release (Bilateral); Tonsillectomy; Wisdom tooth extraction; and Mass excision (Left, 08/12/2013).   Her family history includes Hypertension in her sister. She was adopted.She reports that she has never smoked. She has never used smokeless tobacco. She reports that she drinks about 4.2 - 6.0 oz of alcohol per week. She reports that she does not use illicit drugs.  Outpatient Prescriptions Prior to Visit  Medication Sig Dispense Refill  . amLODipine (NORVASC) 10 MG tablet Take 1 tablet (10 mg total) by mouth daily. 90 tablet 3  . aspirin 81 MG tablet Take 81 mg by mouth daily.    Marland Kitchen atenolol (TENORMIN) 25 MG tablet Take 1 tablet (25 mg total) by mouth daily. 90 tablet 1  . Flaxseed, Linseed, (FLAXSEED OIL) 1000 MG CAPS Take 2 capsules by mouth daily.    . hydrochlorothiazide (HYDRODIURIL) 25 MG tablet Take 1 tablet (25 mg total) by mouth daily. 90 tablet 3  . Omega-3 Fatty Acids (FISH OIL) 1200 MG CAPS Take 2 capsules by mouth daily.    . Vitamin E 400 UNITS TABS Take 2 tablets by mouth once daily 60 tablet 2   No facility-administered medications prior to visit.    ROS Review of Systems  Constitutional: Positive for diaphoresis. Negative for fever, chills and fatigue.  Respiratory: Negative for chest tightness, shortness of breath and wheezing.   Cardiovascular: Negative for chest pain, palpitations and leg swelling.  Gastrointestinal: Negative for nausea,  vomiting and diarrhea.  Genitourinary: Positive for menstrual problem.  Neurological: Negative for dizziness, weakness, numbness and headaches.  Psychiatric/Behavioral: The patient is not nervous/anxious.     Objective:  BP 122/80 mmHg  Pulse 71  Temp(Src) 97.8 F (36.6 C)  Resp 14  Ht 5' 1.75" (1.568 m)  Wt 157 lb (71.215 kg)  BMI 28.97 kg/m2  SpO2 98%  Physical Exam  Constitutional: She is oriented to person, place, and time. She appears well-developed and well-nourished. No distress.  HENT:  Head: Normocephalic and atraumatic.  Right Ear: External ear normal.  Left Ear: External ear normal.  Genitourinary:  Deferred today  Neurological: She is alert and oriented to person, place, and time. No cranial nerve deficit. She exhibits normal muscle tone. Coordination normal.  Skin: Skin is warm and dry. No rash noted. She is not diaphoretic.  Psychiatric: She has a normal mood and affect. Her behavior is normal. Judgment and thought content normal.   Assessment & Plan:   [name] was seen today for amenorrhea.  Diagnoses and all orders for this visit:  Amenorrhea -     Cancel: POCT Urinalysis Dipstick -     Cancel: POCT urine pregnancy  Other orders -     escitalopram (LEXAPRO) 10 MG tablet; Take 1 tablet (10 mg total) by mouth daily.   I am having Ms. Kehoe start on escitalopram. I am also having her maintain her Flaxseed Oil, Fish Oil, aspirin, Vitamin E, hydrochlorothiazide, amLODipine, atenolol, and cholecalciferol.  Meds ordered this encounter  Medications  . cholecalciferol (VITAMIN D) 1000 UNITS tablet    Sig: Take 1,000 Units by mouth daily.  Marland Kitchen escitalopram (LEXAPRO) 10 MG tablet    Sig: Take 1 tablet (10 mg total) by mouth daily.    Dispense:  30 tablet    Refill:  2    Order Specific Question:  Supervising Provider    Answer:  Crecencio Mc [[id number]   Follow-up: No Follow-up on file.

## 2015-02-16 NOTE — Progress Notes (Signed)
Pre visit review using our clinic review tool, if applicable. No additional management support is needed unless otherwise documented below in the visit note. 

## 2015-02-24 ENCOUNTER — Encounter: Payer: Self-pay | Admitting: Internal Medicine

## 2015-02-26 DIAGNOSIS — N912 Amenorrhea, unspecified: Secondary | ICD-10-CM | POA: Insufficient documentation

## 2015-03-04 ENCOUNTER — Ambulatory Visit: Payer: Self-pay | Admitting: Internal Medicine

## 2015-06-10 ENCOUNTER — Other Ambulatory Visit: Payer: Self-pay | Admitting: Internal Medicine

## 2015-07-16 ENCOUNTER — Other Ambulatory Visit: Payer: Self-pay | Admitting: Internal Medicine

## 2015-12-09 ENCOUNTER — Other Ambulatory Visit: Payer: Self-pay | Admitting: Internal Medicine

## 2015-12-10 ENCOUNTER — Encounter: Payer: 59 | Admitting: Internal Medicine

## 2015-12-10 ENCOUNTER — Telehealth: Payer: Self-pay | Admitting: Internal Medicine

## 2015-12-10 NOTE — Telephone Encounter (Signed)
Fine to take off schedule

## 2015-12-10 NOTE — Telephone Encounter (Signed)
Can remove. thanks

## 2015-12-10 NOTE — Telephone Encounter (Signed)
Please advise, No Show??

## 2015-12-10 NOTE — Telephone Encounter (Signed)
Ok. Done 

## 2015-12-10 NOTE — Telephone Encounter (Signed)
FYI, Pt called in stating she cannot make her appt today she had to work. Appt is still on the schedule and pt did reschedule her appt to 07/03. Let me know if pt will be charged a No Show. Thank you!

## 2015-12-28 ENCOUNTER — Ambulatory Visit (INDEPENDENT_AMBULATORY_CARE_PROVIDER_SITE_OTHER): Payer: 59 | Admitting: Internal Medicine

## 2015-12-28 ENCOUNTER — Encounter: Payer: Self-pay | Admitting: Internal Medicine

## 2015-12-28 VITALS — BP 158/90 | HR 71 | Ht 62.0 in | Wt 158.8 lb

## 2015-12-28 DIAGNOSIS — Z0000 Encounter for general adult medical examination without abnormal findings: Secondary | ICD-10-CM | POA: Diagnosis not present

## 2015-12-28 DIAGNOSIS — I10 Essential (primary) hypertension: Secondary | ICD-10-CM | POA: Diagnosis not present

## 2015-12-28 MED ORDER — ESCITALOPRAM OXALATE 10 MG PO TABS: 10.0000 mg | ORAL_TABLET | Freq: Every day | ORAL | Status: DC

## 2015-12-28 NOTE — Patient Instructions (Addendum)
Labs Friday.  Health Maintenance, Female Adopting a healthy lifestyle and getting preventive care can go a long way to promote health and wellness. Talk with your health care provider about what schedule of regular examinations is right for you. This is a good chance for you to check in with your provider about disease prevention and staying healthy. In between checkups, there are plenty of things you can do on your own. Experts have done a lot of research about which lifestyle changes and preventive measures are most likely to keep you healthy. Ask your health care provider for more information. WEIGHT AND DIET  Eat a healthy diet  Be sure to include plenty of vegetables, fruits, low-fat dairy products, and lean protein.  Do not eat a lot of foods high in solid fats, added sugars, or salt.  Get regular exercise. This is one of the most important things you can do for your health.  Most adults should exercise for at least 150 minutes each week. The exercise should increase your heart rate and make you sweat (moderate-intensity exercise).  Most adults should also do strengthening exercises at least twice a week. This is in addition to the moderate-intensity exercise.  Maintain a healthy weight  Body mass index (BMI) is a measurement that can be used to identify possible weight problems. It estimates body fat based on height and weight. Your health care provider can help determine your BMI and help you achieve or maintain a healthy weight.  For females 31 years of age and older:   A BMI below 18.5 is considered underweight.  A BMI of 18.5 to 24.9 is normal.  A BMI of 25 to 29.9 is considered overweight.  A BMI of 30 and above is considered obese.  Watch levels of cholesterol and blood lipids  You should start having your blood tested for lipids and cholesterol at 48 years of age, then have this test every 5 years.  You may need to have your cholesterol levels checked more often  if:  Your lipid or cholesterol levels are high.  You are older than 48 years of age.  You are at high risk for heart disease.  CANCER SCREENING   Lung Cancer  Lung cancer screening is recommended for adults 68-17 years old who are at high risk for lung cancer because of a history of smoking.  A yearly low-dose CT scan of the lungs is recommended for people who:  Currently smoke.  Have quit within the past 15 years.  Have at least a 30-pack-year history of smoking. A pack year is smoking an average of one pack of cigarettes a day for 1 year.  Yearly screening should continue until it has been 15 years since you quit.  Yearly screening should stop if you develop a health problem that would prevent you from having lung cancer treatment.  Breast Cancer  Practice breast self-awareness. This means understanding how your breasts normally appear and feel.  It also means doing regular breast self-exams. Let your health care provider know about any changes, no matter how small.  If you are in your 20s or 30s, you should have a clinical breast exam (CBE) by a health care provider every 1-3 years as part of a regular health exam.  If you are 10 or older, have a CBE every year. Also consider having a breast X-ray (mammogram) every year.  If you have a family history of breast cancer, talk to your health care provider about genetic screening.  If you are at high risk for breast cancer, talk to your health care provider about having an MRI and a mammogram every year.  Breast cancer gene (BRCA) assessment is recommended for women who have family members with BRCA-related cancers. BRCA-related cancers include:  Breast.  Ovarian.  Tubal.  Peritoneal cancers.  Results of the assessment will determine the need for genetic counseling and BRCA1 and BRCA2 testing. Cervical Cancer Your health care provider may recommend that you be screened regularly for cancer of the pelvic organs  (ovaries, uterus, and vagina). This screening involves a pelvic examination, including checking for microscopic changes to the surface of your cervix (Pap test). You may be encouraged to have this screening done every 3 years, beginning at age 5.  For women ages 24-65, health care providers may recommend pelvic exams and Pap testing every 3 years, or they may recommend the Pap and pelvic exam, combined with testing for human papilloma virus (HPV), every 5 years. Some types of HPV increase your risk of cervical cancer. Testing for HPV may also be done on women of any age with unclear Pap test results.  Other health care providers may not recommend any screening for nonpregnant women who are considered low risk for pelvic cancer and who do not have symptoms. Ask your health care provider if a screening pelvic exam is right for you.  If you have had past treatment for cervical cancer or a condition that could lead to cancer, you need Pap tests and screening for cancer for at least 20 years after your treatment. If Pap tests have been discontinued, your risk factors (such as having a new sexual partner) need to be reassessed to determine if screening should resume. Some women have medical problems that increase the chance of getting cervical cancer. In these cases, your health care provider may recommend more frequent screening and Pap tests. Colorectal Cancer  This type of cancer can be detected and often prevented.  Routine colorectal cancer screening usually begins at 48 years of age and continues through 48 years of age.  Your health care provider may recommend screening at an earlier age if you have risk factors for colon cancer.  Your health care provider may also recommend using home test kits to check for hidden blood in the stool.  A small camera at the end of a tube can be used to examine your colon directly (sigmoidoscopy or colonoscopy). This is done to check for the earliest forms of  colorectal cancer.  Routine screening usually begins at age 72.  Direct examination of the colon should be repeated every 5-10 years through 48 years of age. However, you may need to be screened more often if early forms of precancerous polyps or small growths are found. Skin Cancer  Check your skin from head to toe regularly.  Tell your health care provider about any new moles or changes in moles, especially if there is a change in a mole's shape or color.  Also tell your health care provider if you have a mole that is larger than the size of a pencil eraser.  Always use sunscreen. Apply sunscreen liberally and repeatedly throughout the day.  Protect yourself by wearing long sleeves, pants, a wide-brimmed hat, and sunglasses whenever you are outside. HEART DISEASE, DIABETES, AND HIGH BLOOD PRESSURE   High blood pressure causes heart disease and increases the risk of stroke. High blood pressure is more likely to develop in:  People who have blood pressure in the  high end of the normal range (130-139/85-89 mm Hg).  People who are overweight or obese.  People who are African American.  If you are 52-57 years of age, have your blood pressure checked every 3-5 years. If you are 2 years of age or older, have your blood pressure checked every year. You should have your blood pressure measured twice--once when you are at a hospital or clinic, and once when you are not at a hospital or clinic. Record the average of the two measurements. To check your blood pressure when you are not at a hospital or clinic, you can use:  An automated blood pressure machine at a pharmacy.  A home blood pressure monitor.  If you are between 68 years and 54 years old, ask your health care provider if you should take aspirin to prevent strokes.  Have regular diabetes screenings. This involves taking a blood sample to check your fasting blood sugar level.  If you are at a normal weight and have a low risk for  diabetes, have this test once every three years after 48 years of age.  If you are overweight and have a high risk for diabetes, consider being tested at a younger age or more often. PREVENTING INFECTION  Hepatitis B  If you have a higher risk for hepatitis B, you should be screened for this virus. You are considered at high risk for hepatitis B if:  You were born in a country where hepatitis B is common. Ask your health care provider which countries are considered high risk.  Your parents were born in a high-risk country, and you have not been immunized against hepatitis B (hepatitis B vaccine).  You have HIV or AIDS.  You use needles to inject street drugs.  You live with someone who has hepatitis B.  You have had sex with someone who has hepatitis B.  You get hemodialysis treatment.  You take certain medicines for conditions, including cancer, organ transplantation, and autoimmune conditions. Hepatitis C  Blood testing is recommended for:  Everyone born from [birth year] through 1963.  Anyone with known risk factors for hepatitis C. Sexually transmitted infections (STIs)  You should be screened for sexually transmitted infections (STIs) including gonorrhea and chlamydia if:  You are sexually active and are younger than 48 years of age.  You are older than 48 years of age and your health care provider tells you that you are at risk for this type of infection.  Your sexual activity has changed since you were last screened and you are at an increased risk for chlamydia or gonorrhea. Ask your health care provider if you are at risk.  If you do not have HIV, but are at risk, it may be recommended that you take a prescription medicine daily to prevent HIV infection. This is called pre-exposure prophylaxis (PrEP). You are considered at risk if:  You are sexually active and do not regularly use condoms or know the HIV status of your partner(s).  You take drugs by injection.  You are  sexually active with a partner who has HIV. Talk with your health care provider about whether you are at high risk of being infected with HIV. If you choose to begin PrEP, you should first be tested for HIV. You should then be tested every 3 months for as long as you are taking PrEP.  PREGNANCY   If you are premenopausal and you may become pregnant, ask your health care provider about preconception counseling.  If  you may become pregnant, take 400 to 800 micrograms (mcg) of folic acid every day.  If you want to prevent pregnancy, talk to your health care provider about birth control (contraception). OSTEOPOROSIS AND MENOPAUSE   Osteoporosis is a disease in which the bones lose minerals and strength with aging. This can result in serious bone fractures. Your risk for osteoporosis can be identified using a bone density scan.  If you are 64 years of age or older, or if you are at risk for osteoporosis and fractures, ask your health care provider if you should be screened.  Ask your health care provider whether you should take a calcium or vitamin D supplement to lower your risk for osteoporosis.  Menopause may have certain physical symptoms and risks.  Hormone replacement therapy may reduce some of these symptoms and risks. Talk to your health care provider about whether hormone replacement therapy is right for you.  HOME CARE INSTRUCTIONS   Schedule regular health, dental, and eye exams.  Stay current with your immunizations.   Do not use any tobacco products including cigarettes, chewing tobacco, or electronic cigarettes.  If you are pregnant, do not drink alcohol.  If you are breastfeeding, limit how much and how often you drink alcohol.  Limit alcohol intake to no more than 1 drink per day for nonpregnant women. One drink equals 12 ounces of beer, 5 ounces of wine, or 1 ounces of hard liquor.  Do not use street drugs.  Do not share needles.  Ask your health care provider for  help if you need support or information about quitting drugs.  Tell your health care provider if you often feel depressed.  Tell your health care provider if you have ever been abused or do not feel safe at home.   This information is not intended to replace advice given to you by your health care provider. Make sure you discuss any questions you have with your health care provider.   Document Released: 12/27/2010 Document Revised: 07/04/2014 Document Reviewed: 05/15/2013 Elsevier Interactive Patient Education [employer].

## 2015-12-28 NOTE — Progress Notes (Signed)
Subjective:    Patient ID: Shelley Massey, female    DOB: 15-Mar-1966, 50 y.o.   MRN: [id number]  HPI  50YO female presents for physical exam.  Generally feeling well. Notes some lighter menstrual cycles. Feels that she is going into menopause.  HTN - BP well controlled at home [id number] systolic.  Stressful time at work. Short staffed.  No new concerns today.  Wt Readings from Last 3 Encounters:  12/28/15 158 lb 12.8 oz (72.031 kg)  02/16/15 157 lb (71.215 kg)  01/05/15 160 lb (72.576 kg)   BP Readings from Last 3 Encounters:  12/28/15 158/90  02/16/15 122/80  01/05/15 163/88    Past Medical History  Diagnosis Date  . Hypertension   . Hay fever   . Wears glasses   . Elevated LFTs   . Fatty liver    Family History  Problem Relation Age of Onset  . Adopted: Yes  . Hypertension Sister    Past Surgical History  Procedure Laterality Date  . Knee arthroscopy w/ meniscal repair Right 2001    [place], s/p repair x 2  . Carpal tunnel release Bilateral   . Tonsillectomy    . Wisdom tooth extraction    . Mass excision Left 08/12/2013    Procedure: EXCISION VOLAR MASS LEFT WRIST AND THUMB;  Surgeon: Schuyler Amor, MD;  Location: [facility];  Service: Orthopedics;  Laterality: Left;   Social History   Social History  . Marital Status: Single    Spouse Name: N/A  . Number of Children: 0  . Years of Education: N/A   Occupational History  . SUPERVISOR     Social History Main Topics  . Smoking status: Never Smoker   . Smokeless tobacco: Never Used  . Alcohol Use: 4.2 - 6.0 oz/week    6-8 Cans of beer, 1-2 Shots of liquor per week     Comment: Mostly on the weekends.  . Drug Use: No  . Sexual Activity: No     Comment: Lives with partner   Other Topics Concern  . None   Social History Narrative   Lives in [place].      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   Exercise - walking   Diet - healthy     Review of Systems  Constitutional: Negative for fever, chills, appetite change, fatigue and unexpected weight change.  Eyes: Negative for visual disturbance.  Respiratory: Negative for cough and shortness of breath.   Cardiovascular: Negative for chest pain, palpitations and leg swelling.  Gastrointestinal: Negative for nausea, vomiting, abdominal pain, diarrhea and constipation.  Genitourinary: Negative for frequency, vaginal bleeding, vaginal discharge, vaginal pain and menstrual problem.  Musculoskeletal: Negative for myalgias and arthralgias.  Skin: Negative for color change and rash.  Hematological: Negative for adenopathy. Does not bruise/bleed easily.  Psychiatric/Behavioral: Negative for sleep disturbance and dysphoric mood. The patient is not nervous/anxious.        Objective:    BP 158/90 mmHg  Pulse 71  Ht 5\' 2"  (1.575 m)  Wt 158 lb 12.8 oz (72.031 kg)  BMI 29.04 kg/m2  SpO2 97%  LMP 11/26/2015 Physical Exam  Constitutional: She is oriented to person, place, and time. She appears well-developed and well-nourished. No distress.  HENT:  Head: Normocephalic and atraumatic.  Right Ear: External ear normal.  Left Ear: External ear normal.  Nose: Nose normal.  Mouth/Throat: Oropharynx is clear and moist. No  oropharyngeal exudate.  Eyes: Conjunctivae are normal. Pupils are equal, round, and reactive to light. Right eye exhibits no discharge. Left eye exhibits no discharge. No scleral icterus.  Neck: Normal range of motion. Neck supple. No tracheal deviation present. No thyromegaly present.  Cardiovascular: Normal rate, regular rhythm, normal heart sounds and intact distal pulses.  Exam reveals no gallop and no friction rub.   No murmur heard. Pulmonary/Chest: Effort normal and breath sounds normal. No accessory muscle usage. No tachypnea. No respiratory distress. She has no decreased breath sounds. She has no wheezes. She has no rales. She exhibits no tenderness. Right  breast exhibits no inverted nipple, no mass, no nipple discharge, no skin change and no tenderness. Left breast exhibits no inverted nipple, no mass, no nipple discharge, no skin change and no tenderness. Breasts are symmetrical.  Abdominal: Soft. Bowel sounds are normal. She exhibits no distension and no mass. There is no tenderness. There is no rebound and no guarding.  Musculoskeletal: Normal range of motion. She exhibits no edema or tenderness.  Lymphadenopathy:    She has no cervical adenopathy.  Neurological: She is alert and oriented to person, place, and time. No cranial nerve deficit. She exhibits normal muscle tone. Coordination normal.  Skin: Skin is warm and dry. No rash noted. She is not diaphoretic. No erythema. No pallor.  Psychiatric: She has a normal mood and affect. Her behavior is normal. Judgment and thought content normal.          Assessment & Plan:   Problem List Items Addressed This Visit      Unprioritized   Hypertension    BP Readings from Last 3 Encounters:  12/28/15 158/90  02/16/15 122/80  01/05/15 163/88   BP slightly elevated. However, better controlled at home. Will recheck BP on Friday at lab visit.      Routine general medical examination at a health care facility - Primary    General medical exam normal today including breast exam. PAP and pelvic deferred per pt preference, last completed in 2014, at the [place], reportedly normal. Plan repeat PAP in 2019. Fasting labs later this week. Encouraged healthy diet and exercise.      Relevant Orders   MM Digital Screening   CBC with Differential/Platelet   Comprehensive metabolic panel   Lipid panel   Microalbumin / creatinine urine ratio   VITAMIN D 25 Hydroxy (Vit-D Deficiency, Fractures)   TSH   Hemoglobin A1c       Return in about 4 weeks (around 01/25/2016) for New Patient.  Ronette Deter, MD Internal Medicine [facility]

## 2015-12-28 NOTE — Assessment & Plan Note (Signed)
BP Readings from Last 3 Encounters:  12/28/15 158/90  02/16/15 122/80  01/05/15 163/88   BP slightly elevated. However, better controlled at home. Will recheck BP on Friday at lab visit.

## 2015-12-28 NOTE — Assessment & Plan Note (Signed)
General medical exam normal today including breast exam. PAP and pelvic deferred per pt preference, last completed in 2014, at the [place], reportedly normal. Plan repeat PAP in 2019. Fasting labs later this week. Encouraged healthy diet and exercise.

## 2015-12-28 NOTE — Progress Notes (Signed)
Pre visit review using our clinic review tool, if applicable. No additional management support is needed unless otherwise documented below in the visit note. 

## 2016-01-01 ENCOUNTER — Other Ambulatory Visit: Payer: 59

## 2016-01-05 ENCOUNTER — Ambulatory Visit: Payer: 59 | Admitting: Family

## 2016-01-08 ENCOUNTER — Other Ambulatory Visit (INDEPENDENT_AMBULATORY_CARE_PROVIDER_SITE_OTHER): Payer: 59

## 2016-01-08 ENCOUNTER — Encounter: Payer: Self-pay | Admitting: Internal Medicine

## 2016-01-08 DIAGNOSIS — Z0000 Encounter for general adult medical examination without abnormal findings: Secondary | ICD-10-CM

## 2016-01-08 LAB — COMPREHENSIVE METABOLIC PANEL
ALT: 43 U/L — AB (ref 0–35)
AST: 32 U/L (ref 0–37)
Albumin: 4.2 g/dL (ref 3.5–5.2)
Alkaline Phosphatase: 51 U/L (ref 39–117)
BUN: 15 mg/dL (ref 6–23)
CO2: 26 mEq/L (ref 19–32)
Calcium: 9.4 mg/dL (ref 8.4–10.5)
Chloride: 101 mEq/L (ref 96–112)
Creatinine, Ser: 0.58 mg/dL (ref 0.40–1.20)
GFR: 118.14 mL/min (ref >60.00)
Glucose, Bld: 93 mg/dL (ref 70–99)
POTASSIUM: 3.5 meq/L (ref 3.5–5.1)
SODIUM: 137 meq/L (ref 135–145)
TOTAL PROTEIN: 7.6 g/dL (ref 6.0–8.3)
Total Bilirubin: 0.6 mg/dL (ref 0.2–1.2)

## 2016-01-08 LAB — CBC WITH DIFFERENTIAL/PLATELET
BASOS ABS: 0 10*3/uL (ref 0.0–0.1)
Basophils Relative: 0.4 % (ref 0.0–3.0)
Eosinophils Absolute: 0.1 10*3/uL (ref 0.0–0.7)
Eosinophils Relative: 1 % (ref 0.0–5.0)
HCT: 39.7 % (ref 36.0–46.0)
Hemoglobin: 13.3 g/dL (ref 12.0–15.0)
LYMPHS ABS: 1.4 10*3/uL (ref 0.7–4.0)
Lymphocytes Relative: 26.5 % (ref 12.0–46.0)
MCHC: 33.4 g/dL (ref 30.0–36.0)
MCV: 94.5 fl (ref 78.0–100.0)
MONO ABS: 0.6 10*3/uL (ref 0.1–1.0)
Monocytes Relative: 11.6 % (ref 3.0–12.0)
NEUTROS PCT: 60.5 % (ref 43.0–77.0)
Neutro Abs: 3.1 10*3/uL (ref 1.4–7.7)
Platelets: 361 10*3/uL (ref 150.0–400.0)
RBC: 4.21 Mil/uL (ref 3.87–5.11)
RDW: 12.9 % (ref 11.5–15.5)
WBC: 5.1 10*3/uL (ref 4.0–10.5)

## 2016-01-08 LAB — MICROALBUMIN / CREATININE URINE RATIO
Creatinine,U: 208.3 mg/dL
MICROALB UR: 1.1 mg/dL (ref 0.0–1.9)
Microalb Creat Ratio: 0.5 mg/g (ref 0.0–30.0)

## 2016-01-08 LAB — TSH: TSH: 4.82 u[IU]/mL — ABNORMAL HIGH (ref 0.35–4.50)

## 2016-01-08 LAB — LIPID PANEL
CHOL/HDL RATIO: 4
Cholesterol: 204 mg/dL — ABNORMAL HIGH (ref 0–200)
HDL: 48.3 mg/dL (ref >39.00)
LDL Cholesterol: 133 mg/dL — ABNORMAL HIGH (ref 0–99)
NONHDL: 155.79
Triglycerides: 116 mg/dL (ref 0.0–149.0)
VLDL: 23.2 mg/dL (ref 0.0–40.0)

## 2016-01-08 LAB — HEMOGLOBIN A1C: HEMOGLOBIN A1C: 5.2 % (ref 4.6–6.5)

## 2016-01-08 LAB — VITAMIN D 25 HYDROXY (VIT D DEFICIENCY, FRACTURES): VITD: 45.21 ng/mL (ref 30.00–100.00)

## 2016-01-08 NOTE — Telephone Encounter (Signed)
Please advise 

## 2016-01-12 ENCOUNTER — Ambulatory Visit: Payer: 59 | Admitting: Family

## 2016-04-18 DIAGNOSIS — M65352 Trigger finger, left little finger: Secondary | ICD-10-CM | POA: Insufficient documentation

## 2016-05-10 DIAGNOSIS — M65352 Trigger finger, left little finger: Secondary | ICD-10-CM | POA: Diagnosis not present

## 2016-07-11 ENCOUNTER — Telehealth: Payer: Self-pay | Admitting: *Deleted

## 2016-07-11 MED ORDER — HYDROCHLOROTHIAZIDE 25 MG PO TABS: ORAL_TABLET | ORAL | 1 refills | Status: DC

## 2016-07-11 MED ORDER — AMLODIPINE BESYLATE 10 MG PO TABS: ORAL_TABLET | ORAL | 1 refills | Status: DC

## 2016-07-11 NOTE — Telephone Encounter (Signed)
Patient has requested a medication refill amlodipine and hydrochlorothiazide  Pharmacy [facility]

## 2016-07-11 NOTE — Telephone Encounter (Signed)
Medication has been refilled.

## 2016-07-12 ENCOUNTER — Ambulatory Visit (INDEPENDENT_AMBULATORY_CARE_PROVIDER_SITE_OTHER): Payer: 59 | Admitting: Family

## 2016-07-12 ENCOUNTER — Encounter: Payer: Self-pay | Admitting: Family

## 2016-07-12 VITALS — BP 130/86 | HR 98 | Temp 98.0°F | Ht 62.0 in | Wt 161.0 lb

## 2016-07-12 DIAGNOSIS — D1722 Benign lipomatous neoplasm of skin and subcutaneous tissue of left arm: Secondary | ICD-10-CM

## 2016-07-12 NOTE — Progress Notes (Signed)
Subjective:    Patient ID: Shelley Massey, female    DOB: January 02, 1966, 51 y.o.   MRN: [id number]  CC: Shelley Massey is a 51 y.o. female who presents today for an acute visit.    HPI: CC: bump on left forearm for one month, unchanged. 'Pea sized.' Thought it was from lifting. No pain, drainage.       HISTORY:  Past Medical History:  Diagnosis Date  . Elevated LFTs   . Fatty liver   . Hay fever   . Hypertension   . Wears glasses    Past Surgical History:  Procedure Laterality Date  . CARPAL TUNNEL RELEASE Bilateral   . GANGLION CYST EXCISION Left 2015  . KNEE ARTHROSCOPY W/ MENISCAL REPAIR Right 2001   [place], s/p repair x 2  . MASS EXCISION Left 08/12/2013   Procedure: EXCISION VOLAR MASS LEFT WRIST AND THUMB;  Surgeon: Schuyler Amor, MD;  Location: [facility];  Service: Orthopedics;  Laterality: Left;  . TONSILLECTOMY    . WISDOM TOOTH EXTRACTION     Family History  Problem Relation Age of Onset  . Adopted: Yes  . Hypertension Sister     Allergies: Patient has no known allergies. Current Outpatient Prescriptions on File Prior to Visit  Medication Sig Dispense Refill  . amLODipine (NORVASC) 10 MG tablet TAKE 1 TABLET (10 MG TOTAL) BY MOUTH DAILY. 90 tablet 1  . aspirin 81 MG tablet Take 81 mg by mouth daily.    Marland Kitchen atenolol (TENORMIN) 25 MG tablet TAKE 1 TABLET (25 MG TOTAL) BY MOUTH DAILY. 90 tablet 3  . cholecalciferol (VITAMIN D) 1000 UNITS tablet Take 1,000 Units by mouth daily.    Marland Kitchen escitalopram (LEXAPRO) 10 MG tablet Take 1 tablet (10 mg total) by mouth daily. 90 tablet 3  . Flaxseed, Linseed, (FLAXSEED OIL) 1000 MG CAPS Take 2 capsules by mouth daily.    . hydrochlorothiazide (HYDRODIURIL) 25 MG tablet TAKE 1 TABLET (25 MG TOTAL) BY MOUTH DAILY. 90 tablet 1  . Omega-3 Fatty Acids (FISH OIL) 1200 MG CAPS Take 2 capsules by mouth daily.    . Vitamin E 400 UNITS TABS Take 2 tablets by mouth once daily 60 tablet 2   No current  facility-administered medications on file prior to visit.     Social History  Substance Use Topics  . Smoking status: Never Smoker  . Smokeless tobacco: Never Used  . Alcohol use 4.2 - 6.0 oz/week    6 - 8 Cans of beer, 1 - 2 Shots of liquor per week     Comment: Mostly on the weekends.    Review of Systems  Constitutional: Negative for chills and fever.  Respiratory: Negative for cough and shortness of breath.   Cardiovascular: Negative for chest pain and palpitations.  Gastrointestinal: Negative for nausea and vomiting.  Skin: Negative for rash and wound.      Objective:    BP 130/86   Pulse 98   Temp 98 F (36.7 C) (Oral)   Ht 5\' 2"  (1.575 m)   Wt 161 lb (73 kg)   SpO2 98%   BMI 29.45 kg/m    Physical Exam  Constitutional: She appears well-developed and well-nourished.  Eyes: Conjunctivae are normal.  Cardiovascular: Normal rate, regular rhythm, normal heart sounds and normal pulses.   Pulmonary/Chest: Effort normal and breath sounds normal. She has no wheezes. She has no rhonchi. She has no rales.  Neurological: She is alert.  Skin:  Skin is warm and dry.     Discrete < 1cm nodule left forearm. Moveable.   Psychiatric: She has a normal mood and affect. Her speech is normal and behavior is normal. Thought content normal.  Vitals reviewed.      Assessment & Plan:   1. Lipoma of left upper extremity Discrete soft tissue mass. Working diagnosis of lipoma. Pending [place].    - [place] Misc Soft Tissue    I am having Ms. Mccole maintain her Flaxseed Oil, Fish Oil, aspirin, Vitamin E, cholecalciferol, atenolol, escitalopram, hydrochlorothiazide, and amLODipine.   No orders of the defined types were placed in this encounter.   Return precautions given.   Risks, benefits, and alternatives of the medications and treatment plan prescribed today were discussed, and patient expressed understanding.   Education regarding symptom management and diagnosis given to patient  on AVS.  Continue to follow with Mable Paris, FNP for routine health maintenance.   Shelley Massey and I agreed with plan.   Mable Paris, FNP

## 2016-07-12 NOTE — Progress Notes (Signed)
Pre visit review using our clinic review tool, if applicable. No additional management support is needed unless otherwise documented below in the visit note. 

## 2016-07-12 NOTE — Patient Instructions (Signed)
Suspect lipoma Pending ultrasound- will call with appt.   If there is no improvement in your symptoms, or if there is any worsening of symptoms, or if you have any additional concerns, please return for re-evaluation; or, if we are closed, consider going to the Emergency Room for evaluation if symptoms urgent.

## 2016-07-17 ENCOUNTER — Encounter: Payer: Self-pay | Admitting: Family

## 2016-07-18 ENCOUNTER — Other Ambulatory Visit: Payer: Self-pay | Admitting: Family

## 2016-07-19 ENCOUNTER — Other Ambulatory Visit: Payer: Self-pay | Admitting: Family

## 2016-07-19 DIAGNOSIS — D1722 Benign lipomatous neoplasm of skin and subcutaneous tissue of left arm: Secondary | ICD-10-CM

## 2016-07-19 NOTE — Progress Notes (Signed)
Changed to [id number]

## 2016-07-25 ENCOUNTER — Ambulatory Visit
Admission: RE | Admit: 2016-07-25 | Discharge: 2016-07-25 | Disposition: A | Discharge status: Home / Self Care | Payer: 59 | Source: Ambulatory Visit | Attending: Family | Admitting: Family

## 2016-07-25 DIAGNOSIS — D1722 Benign lipomatous neoplasm of skin and subcutaneous tissue of left arm: Secondary | ICD-10-CM | POA: Insufficient documentation

## 2016-07-25 DIAGNOSIS — R2232 Localized swelling, mass and lump, left upper limb: Secondary | ICD-10-CM | POA: Diagnosis not present

## 2016-09-22 DIAGNOSIS — M65352 Trigger finger, left little finger: Secondary | ICD-10-CM | POA: Diagnosis not present

## 2016-10-20 ENCOUNTER — Other Ambulatory Visit: Payer: Self-pay

## 2016-10-20 MED ORDER — ATENOLOL 25 MG PO TABS: ORAL_TABLET | ORAL | 3 refills | Status: DC

## 2016-10-20 NOTE — Telephone Encounter (Signed)
Medication has been refilled.

## 2017-01-02 DIAGNOSIS — M79645 Pain in left finger(s): Secondary | ICD-10-CM | POA: Insufficient documentation

## 2017-01-02 DIAGNOSIS — M65352 Trigger finger, left little finger: Secondary | ICD-10-CM | POA: Diagnosis not present

## 2017-01-06 ENCOUNTER — Other Ambulatory Visit: Payer: Self-pay | Admitting: Family Medicine

## 2017-01-06 NOTE — Telephone Encounter (Signed)
No labs , no OV in one year please advise to refill.

## 2017-01-09 ENCOUNTER — Other Ambulatory Visit: Payer: Self-pay | Admitting: Family

## 2017-01-09 DIAGNOSIS — I10 Essential (primary) hypertension: Secondary | ICD-10-CM

## 2017-01-09 MED ORDER — HYDROCHLOROTHIAZIDE 25 MG PO TABS: ORAL_TABLET | ORAL | 1 refills | Status: DC

## 2017-01-09 MED ORDER — AMLODIPINE BESYLATE 10 MG PO TABS: ORAL_TABLET | ORAL | 1 refills | Status: DC

## 2017-01-09 NOTE — Telephone Encounter (Signed)
Call pt  Refilled norvasc and hctz as courtesy  However very very important she has labs done this week since haven't been done in one year  Order placed; please ensure she makes lab appt and also f/u with me

## 2017-01-09 NOTE — Telephone Encounter (Signed)
Left message for patient to return call back.  

## 2017-01-16 NOTE — Telephone Encounter (Signed)
Closing note due to patient not returning phone call 

## 2017-01-24 DIAGNOSIS — M65352 Trigger finger, left little finger: Secondary | ICD-10-CM | POA: Diagnosis not present

## 2017-01-24 DIAGNOSIS — M79645 Pain in left finger(s): Secondary | ICD-10-CM | POA: Diagnosis not present

## 2017-02-02 ENCOUNTER — Other Ambulatory Visit: Payer: Self-pay | Admitting: Nurse Practitioner

## 2017-02-02 ENCOUNTER — Other Ambulatory Visit: Payer: Self-pay | Admitting: Orthopedic Surgery

## 2017-02-24 ENCOUNTER — Encounter (HOSPITAL_BASED_OUTPATIENT_CLINIC_OR_DEPARTMENT_OTHER): Payer: Self-pay | Admitting: Anesthesiology

## 2017-02-24 ENCOUNTER — Encounter (HOSPITAL_BASED_OUTPATIENT_CLINIC_OR_DEPARTMENT_OTHER)
Admission: RE | Disposition: A | Discharge status: Home / Self Care | Payer: Self-pay | Source: Ambulatory Visit | Attending: Orthopedic Surgery

## 2017-02-24 ENCOUNTER — Ambulatory Visit (HOSPITAL_BASED_OUTPATIENT_CLINIC_OR_DEPARTMENT_OTHER)
Admission: RE | Admit: 2017-02-24 | Discharge: 2017-02-24 | Disposition: A | Discharge status: Home / Self Care | Payer: 59 | Source: Ambulatory Visit | Attending: Orthopedic Surgery | Admitting: Orthopedic Surgery

## 2017-02-24 ENCOUNTER — Encounter (HOSPITAL_BASED_OUTPATIENT_CLINIC_OR_DEPARTMENT_OTHER): Payer: Self-pay

## 2017-02-24 DIAGNOSIS — Z79899 Other long term (current) drug therapy: Secondary | ICD-10-CM | POA: Diagnosis not present

## 2017-02-24 DIAGNOSIS — M659 Synovitis and tenosynovitis, unspecified: Secondary | ICD-10-CM | POA: Insufficient documentation

## 2017-02-24 DIAGNOSIS — I10 Essential (primary) hypertension: Secondary | ICD-10-CM | POA: Diagnosis not present

## 2017-02-24 DIAGNOSIS — Z7982 Long term (current) use of aspirin: Secondary | ICD-10-CM | POA: Insufficient documentation

## 2017-02-24 DIAGNOSIS — M65352 Trigger finger, left little finger: Secondary | ICD-10-CM | POA: Diagnosis not present

## 2017-02-24 HISTORY — PX: TRIGGER FINGER RELEASE: SHX641

## 2017-02-24 SURGERY — MINOR RELEASE TRIGGER FINGER/A-1 PULLEY
Anesthesia: LOCAL | Site: Finger | Laterality: Left

## 2017-02-24 MED ORDER — SODIUM BICARBONATE 4 % IV SOLN
INTRAVENOUS | Status: AC
  Filled 2017-02-24: qty 5

## 2017-02-24 MED ORDER — LIDOCAINE-EPINEPHRINE 1 %-1:100000 IJ SOLN
INTRAMUSCULAR | Status: AC
  Filled 2017-02-24: qty 1

## 2017-02-24 MED ORDER — LIDOCAINE-EPINEPHRINE 1 %-1:100000 IJ SOLN
INTRAMUSCULAR | Status: DC | PRN
  Administered 2017-02-24: 10 mL

## 2017-02-24 MED ORDER — LIDOCAINE-EPINEPHRINE (PF) 1 %-1:200000 IJ SOLN
INTRAMUSCULAR | Status: AC
  Filled 2017-02-24: qty 30

## 2017-02-24 MED ORDER — SODIUM BICARBONATE 4 % IV SOLN
INTRAVENOUS | Status: DC | PRN
  Administered 2017-02-24: 1 mL

## 2017-02-24 MED ORDER — CHLORHEXIDINE GLUCONATE 4 % EX LIQD: 60.0000 mL | Freq: Once | CUTANEOUS | Status: DC

## 2017-02-24 SURGICAL SUPPLY — 43 items
APL SKNCLS STERI-STRIP NONHPOA (GAUZE/BANDAGES/DRESSINGS)
BANDAGE ACE 3X5.8 VEL STRL LF (GAUZE/BANDAGES/DRESSINGS) ×2 IMPLANT
BENZOIN TINCTURE PRP APPL 2/3 (GAUZE/BANDAGES/DRESSINGS) IMPLANT
BLADE SURG 15 STRL LF DISP TIS (BLADE) ×1 IMPLANT
BLADE SURG 15 STRL SS (BLADE) ×2
BNDG COHESIVE 1X5 TAN STRL LF (GAUZE/BANDAGES/DRESSINGS) ×2 IMPLANT
BNDG GAUZE ELAST 4 BULKY (GAUZE/BANDAGES/DRESSINGS) ×2 IMPLANT
CNTNR SPEC C3OZ STD GRAD LEK (MISCELLANEOUS) ×1 IMPLANT
CONT SPEC 3OZ W/LID STRL (MISCELLANEOUS) ×2
CORD BIPOLAR FORCEPS 12FT (ELECTRODE) ×2 IMPLANT
COVER BACK TABLE 60X90IN (DRAPES) ×2 IMPLANT
DECANTER SPIKE VIAL GLASS SM (MISCELLANEOUS) IMPLANT
DRAPE EXTREMITY T 121X128X90 (DRAPE) ×2 IMPLANT
DRAPE SURG 17X23 STRL (DRAPES) ×2 IMPLANT
DURAPREP 26ML APPLICATOR (WOUND CARE) ×2 IMPLANT
GAUZE SPONGE 4X4 12PLY STRL (GAUZE/BANDAGES/DRESSINGS) ×2 IMPLANT
GAUZE SPONGE 4X4 12PLY STRL LF (GAUZE/BANDAGES/DRESSINGS) ×4 IMPLANT
GAUZE XEROFORM 1X8 LF (GAUZE/BANDAGES/DRESSINGS) IMPLANT
GLOVE BIO SURGEON STRL SZ 6.5 (GLOVE) ×2 IMPLANT
GLOVE BIOGEL PI IND STRL 7.0 (GLOVE) ×1 IMPLANT
GLOVE BIOGEL PI INDICATOR 7.0 (GLOVE) ×1
GLOVE SURG SYN 8.0 (GLOVE) ×2 IMPLANT
GOWN STRL REUS W/ TWL LRG LVL3 (GOWN DISPOSABLE) ×1 IMPLANT
GOWN STRL REUS W/ TWL XL LVL3 (GOWN DISPOSABLE) ×1 IMPLANT
GOWN STRL REUS W/TWL LRG LVL3 (GOWN DISPOSABLE) ×2
GOWN STRL REUS W/TWL XL LVL3 (GOWN DISPOSABLE) ×4 IMPLANT
NEEDLE PRECISIONGLIDE 27X1.5 (NEEDLE) ×2 IMPLANT
NS IRRIG 1000ML POUR BTL (IV SOLUTION) ×2 IMPLANT
PACK BASIN DAY SURGERY FS (CUSTOM PROCEDURE TRAY) ×2 IMPLANT
SHEET MEDIUM DRAPE 40X70 STRL (DRAPES) ×2 IMPLANT
STOCKINETTE 4X48 STRL (DRAPES) ×2 IMPLANT
STRIP CLOSURE SKIN 1/2X4 (GAUZE/BANDAGES/DRESSINGS) IMPLANT
SUT ETHILON 4 0 PS 2 18 (SUTURE) IMPLANT
SUT ETHILON 5 0 PS 2 18 (SUTURE) IMPLANT
SUT PROLENE 3 0 PS 2 (SUTURE) IMPLANT
SUT VIC AB 4-0 P-3 18XBRD (SUTURE) ×1 IMPLANT
SUT VIC AB 4-0 P3 18 (SUTURE) ×2
SUT VICRYL RAPIDE 4-0 (SUTURE) IMPLANT
SUT VICRYL RAPIDE 4/0 PS 2 (SUTURE) IMPLANT
SYR 10ML LL (SYRINGE) ×2 IMPLANT
SYR BULB 3OZ (MISCELLANEOUS) ×2 IMPLANT
TOWEL OR 17X24 6PK STRL BLUE (TOWEL DISPOSABLE) ×2 IMPLANT
UNDERPAD 30X30 (UNDERPADS AND DIAPERS) IMPLANT

## 2017-02-24 NOTE — Op Note (Signed)
Please see dictated note [phone]

## 2017-02-24 NOTE — H&P (Signed)
Shelley Massey is an 51 y.o. female.   Chief Complaint: Left small finger trigger digit HPI: Patient's a very pleasant 51 year old female with a history of chronic triggering of her left small finger despite conservative measures.  Past Medical History:  Diagnosis Date  . Elevated LFTs   . Fatty liver   . Hay fever   . Hypertension   . Wears glasses     Past Surgical History:  Procedure Laterality Date  . CARPAL TUNNEL RELEASE Bilateral   . GANGLION CYST EXCISION Left 2015  . KNEE ARTHROSCOPY W/ MENISCAL REPAIR Right 2001   [place], s/p repair x 2  . MASS EXCISION Left 08/12/2013   Procedure: EXCISION VOLAR MASS LEFT WRIST AND THUMB;  Surgeon: Schuyler Amor, MD;  Location: [facility];  Service: Orthopedics;  Laterality: Left;  . TONSILLECTOMY    . WISDOM TOOTH EXTRACTION      Family History  Problem Relation Age of Onset  . Adopted: Yes  . Hypertension Sister    Social History:  reports that she has never smoked. She has never used smokeless tobacco. She reports that she drinks about 4.2 - 6.0 oz of alcohol per week . She reports that she does not use drugs.  Allergies: No Known Allergies  Medications Prior to Admission  Medication Sig Dispense Refill  . amLODipine (NORVASC) 10 MG tablet TAKE 1 TABLET (10 MG TOTAL) BY MOUTH DAILY. 90 tablet 1  . aspirin 81 MG tablet Take 81 mg by mouth daily.    Marland Kitchen atenolol (TENORMIN) 25 MG tablet TAKE 1 TABLET (25 MG TOTAL) BY MOUTH DAILY. 90 tablet 3  . cholecalciferol (VITAMIN D) 1000 UNITS tablet Take 1,000 Units by mouth daily.    Marland Kitchen escitalopram (LEXAPRO) 10 MG tablet Take 1 tablet (10 mg total) by mouth daily. 90 tablet 3  . Flaxseed, Linseed, (FLAXSEED OIL) 1000 MG CAPS Take 2 capsules by mouth daily.    . hydrochlorothiazide (HYDRODIURIL) 25 MG tablet TAKE 1 TABLET (25 MG TOTAL) BY MOUTH DAILY. 90 tablet 1  . Omega-3 Fatty Acids (FISH OIL) 1200 MG CAPS Take 2 capsules by mouth daily.    . Vitamin E 400 UNITS TABS  Take 2 tablets by mouth once daily 60 tablet 2    No results found for this or any previous visit (from the past 48 hour(s)). No results found.  Review of Systems  All other systems reviewed and are negative.   Blood pressure (!) 167/93, pulse 61, temperature 98.7 F (37.1 C), temperature source Oral, resp. rate 17, height 5\' 1"  (1.549 m), weight 72.1 kg (159 lb). Physical Exam  Constitutional: She is oriented to person, place, and time. She appears well-developed and well-nourished.  HENT:  Head: Normocephalic and atraumatic.  Neck: Normal range of motion.  Cardiovascular: Normal rate.   Respiratory: Effort normal.  Musculoskeletal:       Left hand: She exhibits tenderness and swelling.  Chronic left small finger stenosing tenosynovitis with pain and swelling over A1 pulley and flexor sheath  Neurological: She is alert and oriented to person, place, and time.  Skin: Skin is warm.  Psychiatric: She has a normal mood and affect. Her behavior is normal. Judgment and thought content normal.     Assessment/Plan As above. Plan on A1 pulley release left small finger under straight local anesthetic as an outpatient.  Schuyler Amor, MD 02/24/2017, 11:24 AM

## 2017-02-24 NOTE — Op Note (Signed)
NAME:  Deharo, Latonia               ACCOUNT NO.:  [id number]  MEDICAL RECORD NO.:  [id number]  LOCATION:                                 FACILITY:  PHYSICIAN:  Sheral Apley. Burney Gauze, M.D.   DATE OF BIRTH:  DATE OF PROCEDURE:  02/24/2017 DATE OF DISCHARGE:                              OPERATIVE REPORT   PREOPERATIVE DIAGNOSIS:  Chronic left small finger stenosing tenosynovitis.  POSTOPERATIVE DIAGNOSIS:  Chronic left small finger stenosing tenosynovitis.  PROCEDURE:  Left small finger A1 pulley release.  SURGEON:  Sheral Apley. Burney Gauze, MD.  ASSISTANT:  None.  ANESTHESIA:  Local.  TOURNIQUET:  None.  COMPLICATION:  None.  DRAINS:  None.  DESCRIPTION OF PROCEDURE:  Twenty five minutes prior to being taken to the operating suite, I injected a combination of 1% lidocaine with epinephrine 1:100,000 and bicarb solution 8 mL in and around the A1 pulley of the left small finger.  After 25 minutes, the patient was taken to the operating suite, prepped and draped in sterile fashion. Once this was done, oblique incision was made over the A1 pulley, left small finger skin was incised sharply.  Dissection was carried down the A1 pulley.  Neurovascular structures were identified and retracted.  The A1 pulley was split.  The FDS and FDP tendons were lysed of all adhesions.  The patient had full active flexion and extension on the table with no locking.  The wound was irrigated and loosely closed with 4-0 nylon x2 horizontal mattress sutures.  Xeroform, 4 x 4s, and a compressive hand dressing was applied.  The patient tolerated this procedure well and went to recovery room in stable fashion.     Sheral Apley Burney Gauze, M.D.     MAW/MEDQ  D:  02/24/2017  T:  02/24/2017  Job:  [id number]

## 2017-02-28 ENCOUNTER — Encounter (HOSPITAL_BASED_OUTPATIENT_CLINIC_OR_DEPARTMENT_OTHER): Payer: Self-pay | Admitting: Orthopedic Surgery

## 2017-06-13 ENCOUNTER — Other Ambulatory Visit: Payer: Self-pay | Admitting: Family

## 2017-07-06 ENCOUNTER — Other Ambulatory Visit: Payer: Self-pay | Admitting: Family

## 2017-07-06 DIAGNOSIS — I10 Essential (primary) hypertension: Secondary | ICD-10-CM

## 2017-08-29 ENCOUNTER — Telehealth: Payer: Self-pay | Admitting: Family

## 2017-08-29 NOTE — Telephone Encounter (Signed)
Please mail letter-   Hope you are well.   In reviewing your chart, it appears your are due for annual mammogram.  Please let us know if you would like for me to order. You may call the office to let us know, and we will order for you.   Once the order in the system, you may schedule at your preferred location.   Typically women have been using one of the sites below however you may go where you have been in the past. I would ensure that when you do get a mammogram that it is 3D ( as opposed to 2D which was prior technology). Evidence suggests that 3D is superior.   Please note that NOT all insurance companies cover 3D, and you may have to pay a higher copay. You may call your insurance company to further clarify your benefits.   Options for Mammogram in [place]:    [facility]  [address]  [place], [place]  [phone]   [facility] [address] [place], [place] [phone]   Let us know if you have questions.   My best,   Estera Ozier, NP   

## 2017-09-08 ENCOUNTER — Encounter: Payer: Self-pay | Admitting: Family

## 2017-09-08 NOTE — Telephone Encounter (Signed)
Sent mychart

## 2017-09-22 ENCOUNTER — Telehealth: Payer: Self-pay | Admitting: Family

## 2017-09-22 NOTE — Telephone Encounter (Signed)
Unread mychart  Please mail  Shelley Massey, Shelley Massey you are well.    In reviewing your chart, it appears your are due for annual mammogram.    Please let us know if you would like for me to order. You may call the office to let us know, and we will order for you.    Once the order in the system, you may schedule at your preferred location.    Typically women have been using one of the sites below however you may go where you have been in the past. I would ensure that when you do get a mammogram that it is 3D ( as opposed to 2D which was prior technology). Evidence suggests that 3D is superior.    Please note that NOT all insurance companies cover 3D,and you may have to pay a higher copay. You may call your insurance company to further clarify your benefits.    Options for [place]:    [facility]  [place], [place]    [facility]  [place], [place]      Let us know if you have questions.    My best,    Mable Paris, NP

## 2017-09-27 NOTE — Telephone Encounter (Signed)
close

## 2017-10-03 ENCOUNTER — Other Ambulatory Visit: Payer: Self-pay | Admitting: Family

## 2017-10-06 NOTE — Telephone Encounter (Signed)
Call pt  She needs a f/u appt- havent seen her since 2017.  One month courtesy refill. No more until she is seen here.

## 2017-10-06 NOTE — Telephone Encounter (Signed)
NO OV since 12/28/15 ?

## 2017-10-10 NOTE — Telephone Encounter (Signed)
Mailed letter °

## 2017-10-11 ENCOUNTER — Encounter: Payer: Self-pay | Admitting: Podiatry

## 2017-10-11 ENCOUNTER — Ambulatory Visit: Payer: 59 | Admitting: Podiatry

## 2017-10-11 VITALS — BP 155/90 | HR 80 | Resp 16

## 2017-10-11 DIAGNOSIS — Q828 Other specified congenital malformations of skin: Secondary | ICD-10-CM | POA: Diagnosis not present

## 2017-10-11 DIAGNOSIS — M779 Enthesopathy, unspecified: Secondary | ICD-10-CM

## 2017-10-11 DIAGNOSIS — M778 Other enthesopathies, not elsewhere classified: Secondary | ICD-10-CM

## 2017-10-11 NOTE — Progress Notes (Signed)
Subjective:  Patient ID: Shelley Massey, female    DOB: [birth year],  MRN: [id number] HPI Chief Complaint  Patient presents with  . Callouses    Patient presents today for painful callouses bilat hallux and bottom of both feet x years.  She reports they are progressivley getting bigger and worse.  She has used OTC corn pads, new shoes, and insoles which helped some, but they come back.   She states they are the most painful after standing in one place for a long period of time  . New Patient (Initial Visit)    52 y.o. female presents with the above complaint.   ROS: Denies fever chills nausea vomiting muscle aches pains calf pain chest pain back pain shortness of breath and headache.  Past Medical History:  Diagnosis Date  . Elevated LFTs   . Fatty liver   . Hay fever   . Hypertension   . Wears glasses    Past Surgical History:  Procedure Laterality Date  . CARPAL TUNNEL RELEASE Bilateral   . GANGLION CYST EXCISION Left 2015  . KNEE ARTHROSCOPY W/ MENISCAL REPAIR Right 2001   [place], s/p repair x 2  . MASS EXCISION Left 08/12/2013   Procedure: EXCISION VOLAR MASS LEFT WRIST AND THUMB;  Surgeon: Schuyler Amor, MD;  Location: [facility];  Service: Orthopedics;  Laterality: Left;  . TONSILLECTOMY    . TRIGGER FINGER RELEASE Left 02/24/2017   Procedure: MINOR RELEASE TRIGGER FINGER/A-1 PULLEY;  Surgeon: Charlotte Crumb, MD;  Location: [facility];  Service: Orthopedics;  Laterality: Left;  . WISDOM TOOTH EXTRACTION      Current Outpatient Medications:  .  amLODipine (NORVASC) 10 MG tablet, TAKE 1 TABLET (10 MG TOTAL) BY MOUTH DAILY., Disp: 90 tablet, Rfl: 1 .  aspirin 81 MG tablet, Take 81 mg by mouth daily., Disp: , Rfl:  .  atenolol (TENORMIN) 25 MG tablet, TAKE 1 TABLET (25 MG TOTAL) BY MOUTH DAILY., Disp: 90 tablet, Rfl: 3 .  cholecalciferol (VITAMIN D) 1000 UNITS tablet, Take 1,000 Units by mouth daily., Disp: , Rfl:  .  escitalopram  (LEXAPRO) 10 MG tablet, TAKE 1 TABLET BY MOUTH DAILY., Disp: 30 tablet, Rfl: 0 .  Flaxseed, Linseed, (FLAXSEED OIL) 1000 MG CAPS, Take 2 capsules by mouth daily., Disp: , Rfl:  .  hydrochlorothiazide (HYDRODIURIL) 25 MG tablet, TAKE 1 TABLET (25 MG TOTAL) BY MOUTH DAILY., Disp: 90 tablet, Rfl: 1 .  Omega-3 Fatty Acids (FISH OIL) 1200 MG CAPS, Take 2 capsules by mouth daily., Disp: , Rfl:  .  Vitamin E 400 UNITS TABS, Take 2 tablets by mouth once daily, Disp: 60 tablet, Rfl: 2  No Known Allergies Review of Systems Objective:   Vitals:   10/11/17 1328  BP: (!) 155/90  Pulse: 80  Resp: 16    General: Well developed, nourished, in no acute distress, alert and oriented x3   Dermatological: Skin is warm, dry and supple bilateral. Nails x 10 are well maintained; remaining integument appears unremarkable at this time. There are no open sores, no preulcerative lesions, no rash or signs of infection present.  Plantar aspect of the forefoot demonstrates significant reactive hyperkeratosis to the hallux interphalangeal joint plantarly as well as the fifth metatarsal phalangeal joints bilaterally and the lateral aspect of the fifth metatarsal phalangeal joint on the right foot more so than the left.  There is no bleeding no cellulitis just reactive hyperkeratosis and some skin break breakdown associated with acids  that she has been recently applying.  Vascular: Dorsalis Pedis artery and Posterior Tibial artery pedal pulses are 2/4 bilateral with immedate capillary fill time. Pedal hair growth present. No varicosities and no lower extremity edema present bilateral.   Neruologic: Grossly intact via light touch bilateral. Vibratory intact via tuning fork bilateral. Protective threshold with Semmes Wienstein monofilament intact to all pedal sites bilateral. Patellar and Achilles deep tendon reflexes 2+ bilateral. No Babinski or clonus noted bilateral.   Musculoskeletal: No gross boney pedal deformities  bilateral. No pain, crepitus, or limitation noted with foot and ankle range of motion bilateral. Muscular strength 5/5 in all groups tested bilateral.  Gastroc equinus is noted bilateral.  Gait: Unassisted, Nonantalgic.    Radiographs:  None taken  Assessment & Plan:   Assessment: Reactive hyperkeratosis secondary to gastroc equinus bilateral forefoot.  Plan: Debrided reactive hyperkeratosis today we will see her for follow-up with Liliane Channel after her orthotics are been fabricated.     Sheilah Rayos T. [place], [place]

## 2017-10-18 ENCOUNTER — Ambulatory Visit (INDEPENDENT_AMBULATORY_CARE_PROVIDER_SITE_OTHER): Payer: 59 | Admitting: Orthotics

## 2017-10-18 DIAGNOSIS — M779 Enthesopathy, unspecified: Secondary | ICD-10-CM | POA: Diagnosis not present

## 2017-10-18 DIAGNOSIS — Q828 Other specified congenital malformations of skin: Secondary | ICD-10-CM

## 2017-10-18 NOTE — Progress Notes (Signed)
Patient hear to get cast/scanned for CMFO to address several keratomas b/l on feet.  Keratomas were marked with iodine and she was foam cast.   Richy to fab.

## 2017-10-19 ENCOUNTER — Encounter: Payer: Self-pay | Admitting: *Deleted

## 2017-10-19 NOTE — Telephone Encounter (Signed)
Sent mychart message

## 2017-10-24 NOTE — Telephone Encounter (Signed)
Unread mychart message mailed to patient 

## 2017-10-26 ENCOUNTER — Other Ambulatory Visit: Payer: Self-pay | Admitting: Family

## 2017-10-26 NOTE — Telephone Encounter (Signed)
Appointment scheduled 11/13/17 Former Dr Gilford Rile patient

## 2017-10-27 NOTE — Telephone Encounter (Signed)
Refilled out of courtesy for 90 days  She needs to be seen in may.

## 2017-11-08 ENCOUNTER — Ambulatory Visit: Payer: 59 | Admitting: Orthotics

## 2017-11-08 DIAGNOSIS — M778 Other enthesopathies, not elsewhere classified: Secondary | ICD-10-CM

## 2017-11-08 DIAGNOSIS — M779 Enthesopathy, unspecified: Principal | ICD-10-CM

## 2017-11-08 DIAGNOSIS — Q828 Other specified congenital malformations of skin: Secondary | ICD-10-CM

## 2017-11-08 NOTE — Progress Notes (Signed)
Patient came in today to pick up custom made foot orthotics.  The goals were accomplished and the patient reported no dissatisfaction with said orthotics.  Patient was advised of breakin period and how to report any issues. 

## 2017-11-13 ENCOUNTER — Ambulatory Visit: Payer: 59 | Admitting: Family

## 2017-11-13 ENCOUNTER — Encounter: Payer: Self-pay | Admitting: Family

## 2017-11-13 VITALS — BP 140/90 | HR 75 | Temp 98.4°F | Resp 15 | Wt 150.4 lb

## 2017-11-13 DIAGNOSIS — R232 Flushing: Secondary | ICD-10-CM

## 2017-11-13 DIAGNOSIS — I10 Essential (primary) hypertension: Secondary | ICD-10-CM | POA: Diagnosis not present

## 2017-11-13 DIAGNOSIS — Z1231 Encounter for screening mammogram for malignant neoplasm of breast: Secondary | ICD-10-CM | POA: Diagnosis not present

## 2017-11-13 DIAGNOSIS — Z1239 Encounter for other screening for malignant neoplasm of breast: Secondary | ICD-10-CM

## 2017-11-13 MED ORDER — ESCITALOPRAM OXALATE 10 MG PO TABS: 10.0000 mg | ORAL_TABLET | Freq: Every day | ORAL | 1 refills | Status: DC

## 2017-11-13 NOTE — Progress Notes (Signed)
Subjective:    Patient ID: Shelley Massey, female    DOB: [birth year], 52 y.o.   MRN: [id number]  CC: Shelley Massey is a 52 y.o. female who presents today for follow up.   HPI: HTN- compliant with medication . At home 120s/ 80. Denies exertional chest pain or pressure, numbness or tingling radiating to left arm or jaw, palpitations, dizziness, frequent headaches, changes in vision, or shortness of breath.  Excercises regularly. Follow keto diet.  Hot flashes- doing well on lexapro  Due for pap, mammogram     HISTORY:  Past Medical History:  Diagnosis Date  . Elevated LFTs   . Fatty liver   . Hay fever   . Hypertension   . Wears glasses    Past Surgical History:  Procedure Laterality Date  . CARPAL TUNNEL RELEASE Bilateral   . GANGLION CYST EXCISION Left 2015  . KNEE ARTHROSCOPY W/ MENISCAL REPAIR Right 2001   [place], s/p repair x 2  . MASS EXCISION Left 08/12/2013   Procedure: EXCISION VOLAR MASS LEFT WRIST AND THUMB;  Surgeon: Schuyler Amor, MD;  Location: [facility];  Service: Orthopedics;  Laterality: Left;  . TONSILLECTOMY    . TRIGGER FINGER RELEASE Left 02/24/2017   Procedure: MINOR RELEASE TRIGGER FINGER/A-1 PULLEY;  Surgeon: Charlotte Crumb, MD;  Location: [facility];  Service: Orthopedics;  Laterality: Left;  . WISDOM TOOTH EXTRACTION     Family History  Adopted: Yes  Problem Relation Age of Onset  . Hypertension Sister     Allergies: Patient has no known allergies. Current Outpatient Medications on File Prior to Visit  Medication Sig Dispense Refill  . amLODipine (NORVASC) 10 MG tablet TAKE 1 TABLET (10 MG TOTAL) BY MOUTH DAILY. 90 tablet 1  . aspirin 81 MG tablet Take 81 mg by mouth daily.    Marland Kitchen atenolol (TENORMIN) 25 MG tablet TAKE 1 TABLET (25 MG TOTAL) BY MOUTH DAILY. 90 tablet 0  . cholecalciferol (VITAMIN D) 1000 UNITS tablet Take 1,000 Units by mouth daily.    . Flaxseed, Linseed, (FLAXSEED OIL) 1000 MG CAPS  Take 2 capsules by mouth daily.    . hydrochlorothiazide (HYDRODIURIL) 25 MG tablet TAKE 1 TABLET (25 MG TOTAL) BY MOUTH DAILY. 90 tablet 1  . Omega-3 Fatty Acids (FISH OIL) 1200 MG CAPS Take 2 capsules by mouth daily.    . Vitamin E 400 UNITS TABS Take 2 tablets by mouth once daily 60 tablet 2   No current facility-administered medications on file prior to visit.     Social History   Tobacco Use  . Smoking status: Never Smoker  . Smokeless tobacco: Never Used  Substance Use Topics  . Alcohol use: Yes    Alcohol/week: 4.2 - 6.0 oz    Types: 6 - 8 Cans of beer, 1 - 2 Shots of liquor per week    Comment: Mostly on the weekends.  . Drug use: No    Review of Systems  Constitutional: Negative for chills and fever.  Respiratory: Negative for cough.   Cardiovascular: Negative for chest pain and palpitations.  Gastrointestinal: Negative for nausea and vomiting.  Psychiatric/Behavioral: Negative for suicidal ideas.      Objective:    BP 140/90 (BP Location: Left Arm, Patient Position: Sitting, Cuff Size: Normal)   Pulse 75   Temp 98.4 F (36.9 C) (Oral)   Resp 15   Wt 150 lb 6 oz (68.2 kg)   SpO2 97%  BMI 28.41 kg/m  BP Readings from Last 3 Encounters:  11/13/17 140/90  10/11/17 (!) 155/90  02/24/17 (!) 151/70   Wt Readings from Last 3 Encounters:  11/13/17 150 lb 6 oz (68.2 kg)  02/21/17 159 lb (72.1 kg)  07/12/16 161 lb (73 kg)    Physical Exam  Constitutional: She appears well-developed and well-nourished.  Eyes: Conjunctivae are normal.  Cardiovascular: Normal rate, regular rhythm, normal heart sounds and normal pulses.  Pulmonary/Chest: Effort normal and breath sounds normal. She has no wheezes. She has no rhonchi. She has no rales.  Neurological: She is alert.  Skin: Skin is warm and dry.  Psychiatric: She has a normal mood and affect. Her speech is normal and behavior is normal. Thought content normal.  Vitals reviewed.      Assessment & Plan:   Problem  List Items Addressed This Visit      Cardiovascular and Mediastinum   Hypertension - Primary    Elevated, slightly today . Will continue current regimen for now as patient actively loosing weight; patient agreed to send me readings from home.       Hot flashes    Controlled on lexapro. Will continue        Other   Screening for breast cancer    Ordered. Patient will schedule. She will return for cpe      Relevant Orders   MM 3D SCREEN BREAST BILATERAL       I have changed Shelley Massey's escitalopram. I am also having her maintain her Flaxseed Oil, Fish Oil, aspirin, Vitamin E, cholecalciferol, hydrochlorothiazide, amLODipine, and atenolol.   Meds ordered this encounter  Medications  . escitalopram (LEXAPRO) 10 MG tablet    Sig: Take 1 tablet (10 mg total) by mouth daily.    Dispense:  90 tablet    Refill:  1    Return precautions given.   Risks, benefits, and alternatives of the medications and treatment plan prescribed today were discussed, and patient expressed understanding.   Education regarding symptom management and diagnosis given to patient on AVS.  Continue to follow with Burnard Hawthorne, FNP for routine health maintenance.   Ronita Hipps and I agreed with plan.   Shelley Paris, FNP

## 2017-11-13 NOTE — Patient Instructions (Addendum)
Pleasure seeing you  Due for pap smear.   We placed a referral for mammogram this year. I asked that you call one the below locations and schedule this when it is convenient for you.   As discussed, I would like you to ask for 3D mammogram over the traditional 2D mammogram as new evidence suggest 3D is superior.   Please note that NOT all insurance companies cover 3D and you may have to pay a higher copay. You may call your insurance company to further clarify your benefits.   Options for [place]  [place], [place]  * [facility] [place], [place] * Note if you ask for 3D mammogram at this location, you must request [place], [place] location*

## 2017-11-13 NOTE — Assessment & Plan Note (Addendum)
Elevated, slightly today . Will continue current regimen for now as patient actively loosing weight; patient agreed to send me readings from home.

## 2017-11-13 NOTE — Assessment & Plan Note (Signed)
Ordered. Patient will schedule. She will return for cpe

## 2017-11-13 NOTE — Assessment & Plan Note (Signed)
Controlled on lexapro. Will continue

## 2017-11-16 ENCOUNTER — Encounter: Payer: Self-pay | Admitting: Family

## 2017-11-17 ENCOUNTER — Other Ambulatory Visit: Payer: Self-pay | Admitting: Family

## 2017-11-17 MED ORDER — ATENOLOL 50 MG PO TABS: 50.0000 mg | ORAL_TABLET | Freq: Every day | ORAL | 1 refills | Status: DC

## 2017-12-05 DIAGNOSIS — M65352 Trigger finger, left little finger: Secondary | ICD-10-CM | POA: Diagnosis not present

## 2017-12-05 DIAGNOSIS — M65311 Trigger thumb, right thumb: Secondary | ICD-10-CM | POA: Diagnosis not present

## 2017-12-05 DIAGNOSIS — M79644 Pain in right finger(s): Secondary | ICD-10-CM | POA: Diagnosis not present

## 2017-12-05 DIAGNOSIS — M79645 Pain in left finger(s): Secondary | ICD-10-CM | POA: Diagnosis not present

## 2017-12-08 ENCOUNTER — Encounter: Payer: 59 | Admitting: Family

## 2017-12-08 DIAGNOSIS — M65311 Trigger thumb, right thumb: Secondary | ICD-10-CM | POA: Diagnosis not present

## 2017-12-25 ENCOUNTER — Ambulatory Visit (INDEPENDENT_AMBULATORY_CARE_PROVIDER_SITE_OTHER): Payer: 59 | Admitting: Family

## 2017-12-25 ENCOUNTER — Encounter: Payer: Self-pay | Admitting: Family

## 2017-12-25 ENCOUNTER — Other Ambulatory Visit (HOSPITAL_COMMUNITY)
Admission: RE | Admit: 2017-12-25 | Discharge: 2017-12-25 | Disposition: A | Discharge status: Home / Self Care | Payer: 59 | Source: Ambulatory Visit | Attending: Family | Admitting: Family

## 2017-12-25 VITALS — BP 140/90 | HR 57 | Temp 98.2°F | Resp 16 | Ht 61.0 in | Wt 140.8 lb

## 2017-12-25 DIAGNOSIS — Z0000 Encounter for general adult medical examination without abnormal findings: Secondary | ICD-10-CM | POA: Diagnosis not present

## 2017-12-25 DIAGNOSIS — Z23 Encounter for immunization: Secondary | ICD-10-CM | POA: Diagnosis not present

## 2017-12-25 DIAGNOSIS — I10 Essential (primary) hypertension: Secondary | ICD-10-CM | POA: Diagnosis not present

## 2017-12-25 LAB — COMPREHENSIVE METABOLIC PANEL
ALT: 60 U/L — ABNORMAL HIGH (ref 0–35)
AST: 40 U/L — ABNORMAL HIGH (ref 0–37)
Albumin: 4.5 g/dL (ref 3.5–5.2)
Alkaline Phosphatase: 60 U/L (ref 39–117)
BUN: 15 mg/dL (ref 6–23)
CALCIUM: 9.7 mg/dL (ref 8.4–10.5)
CO2: 26 meq/L (ref 19–32)
Chloride: 103 mEq/L (ref 96–112)
Creatinine, Ser: 0.68 mg/dL (ref 0.40–1.20)
GFR: 97.52 mL/min (ref >60.00)
GLUCOSE: 98 mg/dL (ref 70–99)
POTASSIUM: 3.6 meq/L (ref 3.5–5.1)
Sodium: 139 mEq/L (ref 135–145)
Total Bilirubin: 0.4 mg/dL (ref 0.2–1.2)
Total Protein: 8 g/dL (ref 6.0–8.3)

## 2017-12-25 LAB — LIPID PANEL
CHOL/HDL RATIO: 4
Cholesterol: 229 mg/dL — ABNORMAL HIGH (ref 0–200)
HDL: 53.1 mg/dL (ref >39.00)
LDL Cholesterol: 154 mg/dL — ABNORMAL HIGH (ref 0–99)
NonHDL: 176.06
TRIGLYCERIDES: 108 mg/dL (ref 0.0–149.0)
VLDL: 21.6 mg/dL (ref 0.0–40.0)

## 2017-12-25 LAB — VITAMIN D 25 HYDROXY (VIT D DEFICIENCY, FRACTURES): VITD: 64.25 ng/mL (ref 30.00–100.00)

## 2017-12-25 LAB — TSH: TSH: 4.9 u[IU]/mL — ABNORMAL HIGH (ref 0.35–4.50)

## 2017-12-25 NOTE — Assessment & Plan Note (Signed)
CBE performed. Patient understands to schedule mammogram. Pap performed.   Note: We discussed relevant and past history of abnormal labs today at length, we decided to order appropriate labs for screening today based on this discussion. Patient was comfortable with this. Patient was advised if any symptoms were to change after today's visit, to notify me as we may always order additional labs.

## 2017-12-25 NOTE — Patient Instructions (Addendum)
Schedule mammogram  Today we discussed referrals, orders. COLONOSCOPY   I have placed these orders in the system for you.  Please be sure to give [place] a call if you have not heard from our office regarding scheduling a test or regarding referral in a timely manner.  It is very important that you let me know as soon as possible.    Health Maintenance, Female Adopting a healthy lifestyle and getting preventive care can go a long way to promote health and wellness. Talk with your health care provider about what schedule of regular examinations is right for you. This is a good chance for you to check in with your provider about disease prevention and staying healthy. In between checkups, there are plenty of things you can do on your own. Experts have done a lot of research about which lifestyle changes and preventive measures are most likely to keep you healthy. Ask your health care provider for more information. Weight and diet Eat a healthy diet  Be sure to include plenty of vegetables, fruits, low-fat dairy products, and lean protein.  Do not eat a lot of foods high in solid fats, added sugars, or salt.  Get regular exercise. This is one of the most important things you can do for your health. ? Most adults should exercise for at least 150 minutes each week. The exercise should increase your heart rate and make you sweat (moderate-intensity exercise). ? Most adults should also do strengthening exercises at least twice a week. This is in addition to the moderate-intensity exercise.  Maintain a healthy weight  Body mass index (BMI) is a measurement that can be used to identify possible weight problems. It estimates body fat based on height and weight. Your health care provider can help determine your BMI and help you achieve or maintain a healthy weight.  For females 81 years of age and older: ? A BMI below 18.5 is considered underweight. ? A BMI of 18.5 to 24.9 is normal. ? A BMI of 25 to 29.9  is considered overweight. ? A BMI of 30 and above is considered obese.  Watch levels of cholesterol and blood lipids  You should start having your blood tested for lipids and cholesterol at 50 years of age, then have this test every 5 years.  You may need to have your cholesterol levels checked more often if: ? Your lipid or cholesterol levels are high. ? You are older than 50 years of age. ? You are at high risk for heart disease.  Cancer screening Lung Cancer  Lung cancer screening is recommended for adults 11-63 years old who are at high risk for lung cancer because of a history of smoking.  A yearly low-dose CT scan of the lungs is recommended for people who: ? Currently smoke. ? Have quit within the past 15 years. ? Have at least a 30-pack-year history of smoking. A pack year is smoking an average of one pack of cigarettes a day for 1 year.  Yearly screening should continue until it has been 15 years since you quit.  Yearly screening should stop if you develop a health problem that would prevent you from having lung cancer treatment.  Breast Cancer  Practice breast self-awareness. This means understanding how your breasts normally appear and feel.  It also means doing regular breast self-exams. Let your health care provider know about any changes, no matter how small.  If you are in your 20s or 30s, you should have a  clinical breast exam (CBE) by a health care provider every 1-3 years as part of a regular health exam.  If you are 69 or older, have a CBE every year. Also consider having a breast X-ray (mammogram) every year.  If you have a family history of breast cancer, talk to your health care provider about genetic screening.  If you are at high risk for breast cancer, talk to your health care provider about having an MRI and a mammogram every year.  Breast cancer gene (BRCA) assessment is recommended for women who have family members with BRCA-related cancers.  BRCA-related cancers include: ? Breast. ? Ovarian. ? Tubal. ? Peritoneal cancers.  Results of the assessment will determine the need for genetic counseling and BRCA1 and BRCA2 testing.  Cervical Cancer Your health care provider may recommend that you be screened regularly for cancer of the pelvic organs (ovaries, uterus, and vagina). This screening involves a pelvic examination, including checking for microscopic changes to the surface of your cervix (Pap test). You may be encouraged to have this screening done every 3 years, beginning at age 53.  For women ages 44-65, health care providers may recommend pelvic exams and Pap testing every 3 years, or they may recommend the Pap and pelvic exam, combined with testing for human papilloma virus (HPV), every 5 years. Some types of HPV increase your risk of cervical cancer. Testing for HPV may also be done on women of any age with unclear Pap test results.  Other health care providers may not recommend any screening for nonpregnant women who are considered low risk for pelvic cancer and who do not have symptoms. Ask your health care provider if a screening pelvic exam is right for you.  If you have had past treatment for cervical cancer or a condition that could lead to cancer, you need Pap tests and screening for cancer for at least 20 years after your treatment. If Pap tests have been discontinued, your risk factors (such as having a new sexual partner) need to be reassessed to determine if screening should resume. Some women have medical problems that increase the chance of getting cervical cancer. In these cases, your health care provider may recommend more frequent screening and Pap tests.  Colorectal Cancer  This type of cancer can be detected and often prevented.  Routine colorectal cancer screening usually begins at 51 years of age and continues through 50 years of age.  Your health care provider may recommend screening at an earlier age if  you have risk factors for colon cancer.  Your health care provider may also recommend using home test kits to check for hidden blood in the stool.  A small camera at the end of a tube can be used to examine your colon directly (sigmoidoscopy or colonoscopy). This is done to check for the earliest forms of colorectal cancer.  Routine screening usually begins at age 66.  Direct examination of the colon should be repeated every 5-10 years through 50 years of age. However, you may need to be screened more often if early forms of precancerous polyps or small growths are found.  Skin Cancer  Check your skin from head to toe regularly.  Tell your health care provider about any new moles or changes in moles, especially if there is a change in a mole's shape or color.  Also tell your health care provider if you have a mole that is larger than the size of a pencil eraser.  Always use sunscreen.  Apply sunscreen liberally and repeatedly throughout the day.  Protect yourself by wearing long sleeves, pants, a wide-brimmed hat, and sunglasses whenever you are outside.  Heart disease, diabetes, and high blood pressure  High blood pressure causes heart disease and increases the risk of stroke. High blood pressure is more likely to develop in: ? People who have blood pressure in the high end of the normal range (130-139/85-89 mm Hg). ? People who are overweight or obese. ? People who are African American.  If you are 33-55 years of age, have your blood pressure checked every 3-5 years. If you are 13 years of age or older, have your blood pressure checked every year. You should have your blood pressure measured twice-once when you are at a hospital or clinic, and once when you are not at a hospital or clinic. Record the average of the two measurements. To check your blood pressure when you are not at a hospital or clinic, you can use: ? An automated blood pressure machine at a pharmacy. ? A home blood  pressure monitor.  If you are between 41 years and 67 years old, ask your health care provider if you should take aspirin to prevent strokes.  Have regular diabetes screenings. This involves taking a blood sample to check your fasting blood sugar level. ? If you are at a normal weight and have a low risk for diabetes, have this test once every three years after 50 years of age. ? If you are overweight and have a high risk for diabetes, consider being tested at a younger age or more often. Preventing infection Hepatitis B  If you have a higher risk for hepatitis B, you should be screened for this virus. You are considered at high risk for hepatitis B if: ? You were born in a country where hepatitis B is common. Ask your health care provider which countries are considered high risk. ? Your parents were born in a high-risk country, and you have not been immunized against hepatitis B (hepatitis B vaccine). ? You have HIV or AIDS. ? You use needles to inject street drugs. ? You live with someone who has hepatitis B. ? You have had sex with someone who has hepatitis B. ? You get hemodialysis treatment. ? You take certain medicines for conditions, including cancer, organ transplantation, and autoimmune conditions.  Hepatitis C  Blood testing is recommended for: ? Everyone born from [birth year] through 1963. ? Anyone with known risk factors for hepatitis C.  Sexually transmitted infections (STIs)  You should be screened for sexually transmitted infections (STIs) including gonorrhea and chlamydia if: ? You are sexually active and are younger than 50 years of age. ? You are older than 50 years of age and your health care provider tells you that you are at risk for this type of infection. ? Your sexual activity has changed since you were last screened and you are at an increased risk for chlamydia or gonorrhea. Ask your health care provider if you are at risk.  If you do not have HIV, but are at risk,  it may be recommended that you take a prescription medicine daily to prevent HIV infection. This is called pre-exposure prophylaxis (PrEP). You are considered at risk if: ? You are sexually active and do not regularly use condoms or know the HIV status of your partner(s). ? You take drugs by injection. ? You are sexually active with a partner who has HIV.  Talk with your health care  provider about whether you are at high risk of being infected with HIV. If you choose to begin PrEP, you should first be tested for HIV. You should then be tested every 3 months for as long as you are taking PrEP. Pregnancy  If you are premenopausal and you may become pregnant, ask your health care provider about preconception counseling.  If you may become pregnant, take 400 to 800 micrograms (mcg) of folic acid every day.  If you want to prevent pregnancy, talk to your health care provider about birth control (contraception). Osteoporosis and menopause  Osteoporosis is a disease in which the bones lose minerals and strength with aging. This can result in serious bone fractures. Your risk for osteoporosis can be identified using a bone density scan.  If you are 61 years of age or older, or if you are at risk for osteoporosis and fractures, ask your health care provider if you should be screened.  Ask your health care provider whether you should take a calcium or vitamin D supplement to lower your risk for osteoporosis.  Menopause may have certain physical symptoms and risks.  Hormone replacement therapy may reduce some of these symptoms and risks. Talk to your health care provider about whether hormone replacement therapy is right for you. Follow these instructions at home:  Schedule regular health, dental, and eye exams.  Stay current with your immunizations.  Do not use any tobacco products including cigarettes, chewing tobacco, or electronic cigarettes.  If you are pregnant, do not drink  alcohol.  If you are breastfeeding, limit how much and how often you drink alcohol.  Limit alcohol intake to no more than 1 drink per day for nonpregnant women. One drink equals 12 ounces of beer, 5 ounces of wine, or 1 ounces of hard liquor.  Do not use street drugs.  Do not share needles.  Ask your health care provider for help if you need support or information about quitting drugs.  Tell your health care provider if you often feel depressed.  Tell your health care provider if you have ever been abused or do not feel safe at home. This information is not intended to replace advice given to you by your health care provider. Make sure you discuss any questions you have with your health care provider. Document Released: 12/27/2010 Document Revised: 11/19/2015 Document Reviewed: 03/17/2015 Elsevier Interactive Patient Education  [employer].

## 2017-12-25 NOTE — Progress Notes (Signed)
Subjective:    Patient ID: Shelley Massey, female    DOB: 08/30/1965, 52 y.o.   MRN: [id number]  CC: Shelley Massey is a 52 y.o. female who presents today for physical exam.    HPI: Doing well today.  Still doing keto. No fatigue.   HTN- compliant with medications Denies exertional chest pain or pressure, numbness or tingling radiating to left arm or jaw, palpitations, dizziness, frequent headaches, changes in vision, or shortness of breath.     Colorectal Cancer Screening: due 05/2018. Ordered Breast Cancer Screening: Mammogram due; ordered at last visit.  Cervical Cancer Screening: due Bone Health screening/DEXA for 65+: No increased fracture risk. Defer screening at this time. Lung Cancer Screening: Doesn't have 30 year pack year history and age > 19 years.       Tetanus - due          HIV Screening- Candidate for , consents Labs: Screening labs today. Exercise: Gets regular exercise.  Alcohol use: weekends Smoking/tobacco use: Nonsmoker.  Regular dental exams: In need of dental exam.  Wears seat belt: Yes.  Skin: no concerns.   HISTORY:  Past Medical History:  Diagnosis Date  . Elevated LFTs   . Fatty liver   . Hay fever   . Hypertension   . Wears glasses     Past Surgical History:  Procedure Laterality Date  . CARPAL TUNNEL RELEASE Bilateral   . GANGLION CYST EXCISION Left 2015  . KNEE ARTHROSCOPY W/ MENISCAL REPAIR Right 2001   [place], s/p repair x 2  . MASS EXCISION Left 08/12/2013   Procedure: EXCISION VOLAR MASS LEFT WRIST AND THUMB;  Surgeon: Schuyler Amor, MD;  Location: [facility];  Service: Orthopedics;  Laterality: Left;  . TONSILLECTOMY    . TRIGGER FINGER RELEASE Left 02/24/2017   Procedure: MINOR RELEASE TRIGGER FINGER/A-1 PULLEY;  Surgeon: Charlotte Crumb, MD;  Location: [facility];  Service: Orthopedics;  Laterality: Left;  . WISDOM TOOTH EXTRACTION     Family History  Adopted: Yes  Problem Relation Age  of Onset  . Hypertension Sister       ALLERGIES: Patient has no known allergies.  Current Outpatient Medications on File Prior to Visit  Medication Sig Dispense Refill  . amLODipine (NORVASC) 10 MG tablet TAKE 1 TABLET (10 MG TOTAL) BY MOUTH DAILY. 90 tablet 1  . aspirin 81 MG tablet Take 81 mg by mouth daily.    Marland Kitchen atenolol (TENORMIN) 50 MG tablet Take 1 tablet (50 mg total) by mouth daily. 90 tablet 1  . cholecalciferol (VITAMIN D) 1000 UNITS tablet Take 1,000 Units by mouth daily.    Marland Kitchen escitalopram (LEXAPRO) 10 MG tablet Take 1 tablet (10 mg total) by mouth daily. 90 tablet 1  . Flaxseed, Linseed, (FLAXSEED OIL) 1000 MG CAPS Take 2 capsules by mouth daily.    . hydrochlorothiazide (HYDRODIURIL) 25 MG tablet TAKE 1 TABLET (25 MG TOTAL) BY MOUTH DAILY. 90 tablet 1  . Omega-3 Fatty Acids (FISH OIL) 1200 MG CAPS Take 2 capsules by mouth daily.    . Vitamin E 400 UNITS TABS Take 2 tablets by mouth once daily 60 tablet 2   No current facility-administered medications on file prior to visit.     Social History   Tobacco Use  . Smoking status: Never Smoker  . Smokeless tobacco: Never Used  Substance Use Topics  . Alcohol use: Yes    Alcohol/week: 4.2 - 6.0 oz  Types: 6 - 8 Cans of beer, 1 - 2 Shots of liquor per week    Comment: Mostly on the weekends.  . Drug use: No    Review of Systems  Constitutional: Negative for chills, fatigue, fever and unexpected weight change.  HENT: Negative for congestion.   Respiratory: Negative for cough.   Cardiovascular: Negative for chest pain, palpitations and leg swelling.  Gastrointestinal: Negative for nausea and vomiting.  Genitourinary: Negative for difficulty urinating, dyspareunia and vaginal pain.  Musculoskeletal: Negative for arthralgias and myalgias.  Skin: Negative for rash.  Neurological: Negative for headaches.  Hematological: Negative for adenopathy.  Psychiatric/Behavioral: Negative for confusion.      Objective:    BP  140/90 (BP Location: Left Arm, Patient Position: Sitting, Cuff Size: Normal)   Pulse (!) 57   Temp 98.2 F (36.8 C) (Oral)   Resp 16   Ht 5\' 1"  (1.549 m)   Wt 140 lb 12 oz (63.8 kg)   SpO2 98%   BMI 26.59 kg/m   BP Readings from Last 3 Encounters:  12/25/17 140/90  11/13/17 140/90  10/11/17 (!) 155/90   Wt Readings from Last 3 Encounters:  12/25/17 140 lb 12 oz (63.8 kg)  11/13/17 150 lb 6 oz (68.2 kg)  02/21/17 159 lb (72.1 kg)    Physical Exam  Constitutional: She appears well-developed and well-nourished.  Eyes: Conjunctivae are normal.  Neck: No thyroid mass and no thyromegaly present.  Cardiovascular: Normal rate, regular rhythm, normal heart sounds and normal pulses.  Pulmonary/Chest: Effort normal and breath sounds normal. She has no wheezes. She has no rhonchi. She has no rales. Right breast exhibits no inverted nipple, no mass, no nipple discharge, no skin change and no tenderness. Left breast exhibits no inverted nipple, no mass, no nipple discharge, no skin change and no tenderness. Breasts are symmetrical.  No masses or asymmetry appreciated during CBE.  Genitourinary: Uterus is not enlarged, not fixed and not tender. Cervix exhibits no motion tenderness, no discharge and no friability. Right adnexum displays no mass, no tenderness and no fullness. Left adnexum displays no mass, no tenderness and no fullness.  Genitourinary Comments: Pap performed. No CMT. Unable to appreciated ovaries.  Lymphadenopathy:       Head (right side): No submental, no submandibular, no tonsillar, no preauricular, no posterior auricular and no occipital adenopathy present.       Head (left side): No submental, no submandibular, no tonsillar, no preauricular, no posterior auricular and no occipital adenopathy present.       Right cervical: No superficial cervical, no deep cervical and no posterior cervical adenopathy present.      Left cervical: No superficial cervical, no deep cervical and no  posterior cervical adenopathy present.    She has no axillary adenopathy.       Right axillary: No pectoral and no lateral adenopathy present.       Left axillary: No pectoral and no lateral adenopathy present. Neurological: She is alert.  Skin: Skin is warm and dry.  Psychiatric: She has a normal mood and affect. Her speech is normal and behavior is normal. Thought content normal.  Vitals reviewed.      Assessment & Plan:   Problem List Items Addressed This Visit      Cardiovascular and Mediastinum   Hypertension    Reasonably controlled. In the setting of active weight  Loss, no medication changes made today. Patient will keep log. If persists, will further evaluate for alternative causes as  patient is on 3 blood pressure medications        Other   Routine general medical examination at a health care facility - Primary    CBE performed. Patient understands to schedule mammogram. Pap performed.   Note: We discussed relevant and past history of abnormal labs today at length, we decided to order appropriate labs for screening today based on this discussion. Patient was comfortable with this. Patient was advised if any symptoms were to change after today's visit, to notify me as we may always order additional labs.       Relevant Orders   Comprehensive metabolic panel   Lipid panel   VITAMIN D 25 Hydroxy (Vit-D Deficiency, Fractures)   TSH   HIV antibody   Cytology - PAP   Ambulatory referral to Gastroenterology    Other Visit Diagnoses    Need for diphtheria-tetanus-pertussis (Tdap) vaccine       Relevant Orders   Tdap vaccine greater than or equal to 7yo IM (Completed)       I am having Shelley Massey maintain her Flaxseed Oil, Fish Oil, aspirin, Vitamin E, cholecalciferol, hydrochlorothiazide, amLODipine, escitalopram, and atenolol.   No orders of the defined types were placed in this encounter.   Return precautions given.   Risks, benefits, and alternatives  of the medications and treatment plan prescribed today were discussed, and patient expressed understanding.   Education regarding symptom management and diagnosis given to patient on AVS.   Continue to follow with Burnard Hawthorne, FNP for routine health maintenance.   Ronita Hipps and I agreed with plan.   Mable Paris, FNP

## 2017-12-25 NOTE — Assessment & Plan Note (Addendum)
Reasonably controlled. In the setting of active weight  Loss, no medication changes made today. Patient will keep log. If persists, will further evaluate for alternative causes as patient is on 3 blood pressure medications

## 2017-12-26 ENCOUNTER — Encounter: Payer: Self-pay | Admitting: Family

## 2017-12-26 ENCOUNTER — Other Ambulatory Visit: Payer: Self-pay | Admitting: Internal Medicine

## 2017-12-26 DIAGNOSIS — R7989 Other specified abnormal findings of blood chemistry: Secondary | ICD-10-CM

## 2017-12-26 LAB — HIV ANTIBODY (ROUTINE TESTING W REFLEX): HIV 1&2 Ab, 4th Generation: NONREACTIVE

## 2017-12-26 LAB — CYTOLOGY - PAP
Adequacy: ABSENT
Diagnosis: NEGATIVE
HPV: NOT DETECTED

## 2017-12-29 ENCOUNTER — Telehealth: Payer: Self-pay | Admitting: Internal Medicine

## 2017-12-29 NOTE — Telephone Encounter (Signed)
Cholesterol #s worse goal total cholesterol <200 and LDL <100 with h/o elevated blood pressure  #s gone up  Mail cholesterol handout  I rec cholesterol medication  Further ?s make appt with PCP   Brandon

## 2017-12-29 NOTE — Telephone Encounter (Signed)
Patient advised of below , she wishes  to try healthy diet and exercise first before starting cholesterol medication. Mailed  cholesterol handout.

## 2017-12-29 NOTE — Telephone Encounter (Signed)
Mailed cholesterol handout. Patient wishes to hold off on cholesterol medication at this . She wishes to work on diet.

## 2018-01-01 NOTE — Telephone Encounter (Signed)
Noted  We will follow cholesterol

## 2018-01-04 ENCOUNTER — Other Ambulatory Visit: Payer: Self-pay | Admitting: Family

## 2018-01-04 DIAGNOSIS — I10 Essential (primary) hypertension: Secondary | ICD-10-CM

## 2018-01-05 ENCOUNTER — Telehealth: Payer: Self-pay | Admitting: Family

## 2018-01-05 ENCOUNTER — Other Ambulatory Visit (INDEPENDENT_AMBULATORY_CARE_PROVIDER_SITE_OTHER): Payer: 59

## 2018-01-05 DIAGNOSIS — R7989 Other specified abnormal findings of blood chemistry: Secondary | ICD-10-CM

## 2018-01-05 DIAGNOSIS — I10 Essential (primary) hypertension: Secondary | ICD-10-CM

## 2018-01-05 MED ORDER — AMLODIPINE BESYLATE 10 MG PO TABS: ORAL_TABLET | ORAL | 1 refills | Status: DC

## 2018-01-05 MED ORDER — HYDROCHLOROTHIAZIDE 25 MG PO TABS: ORAL_TABLET | ORAL | 1 refills | Status: DC

## 2018-01-05 NOTE — Telephone Encounter (Signed)
Copied from [place] [id number]. Topic: Inquiry >> Jan 05, 2018 10:42 AM Pricilla Handler wrote: Reason for CRM: Patient caleed requesting refills of AmLODipine (NORVASC) 10 MG tablet and Hydrochlorothiazide (HYDRODIURIL) 25 MG tablet. Patient is completely out of both. Patient has contacted her preferred pharmacy which is [facility], [place] [phone] (Phone)  [phone] (Fax).       Thank You!!!

## 2018-01-05 NOTE — Addendum Note (Signed)
Addended by: Arby Barrette on: 01/05/2018 10:23 AM   Modules accepted: Orders

## 2018-01-08 LAB — THYROID PEROXIDASE ANTIBODY: Thyroperoxidase Ab SerPl-aCnc: 549 IU/mL — ABNORMAL HIGH (ref <9)

## 2018-01-08 LAB — T4, FREE: FREE T4: 1.1 ng/dL (ref 0.8–1.8)

## 2018-01-08 LAB — T3, FREE: T3, Free: 3.1 pg/mL (ref 2.3–4.2)

## 2018-01-31 ENCOUNTER — Encounter: Payer: Self-pay | Admitting: Family

## 2018-02-07 ENCOUNTER — Other Ambulatory Visit: Payer: Self-pay

## 2018-02-07 DIAGNOSIS — Z1211 Encounter for screening for malignant neoplasm of colon: Secondary | ICD-10-CM

## 2018-04-24 ENCOUNTER — Telehealth: Payer: Self-pay

## 2018-04-24 NOTE — Telephone Encounter (Signed)
Contacted patient this am to let her know that we are able to keep her colonoscopy date as scheduled for 05/04/18.  She said that she is going through some personal issues at this time and would like to cancel her colonoscopy.  I've asked her to call us back when she is ready to reschedule.  Trish in Endoscopy has been notified.  Thanks [employer]

## 2018-05-04 ENCOUNTER — Ambulatory Visit: Admit: 2018-05-04 | Payer: 59 | Admitting: Gastroenterology

## 2018-05-04 SURGERY — COLONOSCOPY WITH PROPOFOL
Anesthesia: General

## 2018-05-21 ENCOUNTER — Encounter: Payer: Self-pay | Admitting: Family

## 2018-05-25 ENCOUNTER — Encounter: Payer: Self-pay | Admitting: Family

## 2018-05-28 ENCOUNTER — Other Ambulatory Visit: Payer: Self-pay

## 2018-05-28 DIAGNOSIS — I10 Essential (primary) hypertension: Secondary | ICD-10-CM

## 2018-05-28 MED ORDER — ESCITALOPRAM OXALATE 10 MG PO TABS: 10.0000 mg | ORAL_TABLET | Freq: Every day | ORAL | 1 refills | Status: DC

## 2018-05-28 MED ORDER — HYDROCHLOROTHIAZIDE 25 MG PO TABS: ORAL_TABLET | ORAL | 1 refills | Status: DC

## 2018-05-28 MED ORDER — ATENOLOL 50 MG PO TABS: 50.0000 mg | ORAL_TABLET | Freq: Every day | ORAL | 1 refills | Status: DC

## 2018-05-28 MED ORDER — AMLODIPINE BESYLATE 10 MG PO TABS: ORAL_TABLET | ORAL | 1 refills | Status: DC

## 2018-12-18 ENCOUNTER — Other Ambulatory Visit: Payer: Self-pay | Admitting: Family

## 2018-12-31 ENCOUNTER — Other Ambulatory Visit: Payer: Self-pay | Admitting: Family

## 2018-12-31 DIAGNOSIS — I10 Essential (primary) hypertension: Secondary | ICD-10-CM

## 2019-03-17 ENCOUNTER — Other Ambulatory Visit: Payer: Self-pay | Admitting: Family

## 2019-03-31 ENCOUNTER — Other Ambulatory Visit: Payer: Self-pay | Admitting: Family

## 2019-03-31 DIAGNOSIS — I10 Essential (primary) hypertension: Secondary | ICD-10-CM

## 2019-04-01 ENCOUNTER — Other Ambulatory Visit: Payer: Self-pay | Admitting: Family

## 2019-04-01 DIAGNOSIS — I10 Essential (primary) hypertension: Secondary | ICD-10-CM

## 2019-04-01 MED ORDER — AMLODIPINE BESYLATE 10 MG PO TABS: ORAL_TABLET | ORAL | 0 refills | Status: DC

## 2019-04-01 MED ORDER — HYDROCHLOROTHIAZIDE 25 MG PO TABS: ORAL_TABLET | ORAL | 0 refills | Status: DC

## 2019-04-01 NOTE — Telephone Encounter (Signed)
Pt states that she is completely out of medication and is needing urgent refill. Please advise

## 2019-04-01 NOTE — Telephone Encounter (Signed)
Pt has an appt on 04-08-2019 with lauren. Pt needs a refill on amlodipine and hctz. [facility] [address]

## 2019-04-01 NOTE — Telephone Encounter (Signed)
Refill sent.  Patient aware that a 30 day supply has been sent to pharmacy and that pt will need to keep her scheduled appt on 04-08-19 with Lauren Guse to get any further refills.

## 2019-04-08 ENCOUNTER — Other Ambulatory Visit: Payer: Self-pay

## 2019-04-08 ENCOUNTER — Encounter: Payer: Self-pay | Admitting: Family Medicine

## 2019-04-08 ENCOUNTER — Ambulatory Visit: Payer: Self-pay | Admitting: Family Medicine

## 2019-04-08 VITALS — BP 98/72 | HR 79 | Temp 96.7°F | Resp 16 | Ht 61.0 in | Wt 175.0 lb

## 2019-04-08 DIAGNOSIS — R232 Flushing: Secondary | ICD-10-CM

## 2019-04-08 DIAGNOSIS — I10 Essential (primary) hypertension: Secondary | ICD-10-CM

## 2019-04-08 MED ORDER — AMLODIPINE BESYLATE 10 MG PO TABS: ORAL_TABLET | ORAL | 1 refills | Status: DC

## 2019-04-08 MED ORDER — HYDROCHLOROTHIAZIDE 25 MG PO TABS: ORAL_TABLET | ORAL | 1 refills | Status: DC

## 2019-04-08 MED ORDER — ATENOLOL 50 MG PO TABS: 50.0000 mg | ORAL_TABLET | Freq: Every day | ORAL | 1 refills | Status: DC

## 2019-04-08 MED ORDER — ESCITALOPRAM OXALATE 10 MG PO TABS: 10.0000 mg | ORAL_TABLET | Freq: Every day | ORAL | 1 refills | Status: DC

## 2019-04-08 NOTE — Progress Notes (Signed)
Subjective:    Patient ID: Shelley Massey, female    DOB: Sep 26, 1965, 53 y.o.   MRN: [id number]  HPI  Patient presents to clinic for follow-up on hypertension and menopausal symptoms.  Tolerating her blood pressure medications without any issues.  Denies chest pain, shortness of breath, palpitations, cough or any lower extremity swelling.  She is taking Lexapro for the past couple of years to control hot flash symptoms with great effect.  Patient did not want to do hormone therapy, so has found great benefit in the Lexapro.  Her menopausal symptoms are all under control.  Denies any SI or HI.  Denies mood swings.  Patient currently does not have insurance, but is happy to report she has secured a job through the [place] and is hopeful her insurance will be instated soon.  Patient also is a veteran, has avoided the [place] system in the past due to not being a fan of their care however over the past year or so has had reports from trusted friends who are also veterans and plans to begin reusing the [place] for their lab services; cancer screening services.  Patient Active Problem List   Diagnosis Date Noted  . Hot flashes 11/13/2017  . Thumb pain, left 01/02/2017  . Trigger little finger of left hand 04/18/2016  . Routine general medical examination at a health care facility 12/09/2014  . BMI 30.0-30.9,adult 12/09/2014  . Allergic rhinitis 11/08/2013  . Hypertension 02/22/2012  . Screening for breast cancer 02/22/2012  . Hyperlipidemia LDL goal < 100 02/22/2012   Social History   Tobacco Use  . Smoking status: Never Smoker  . Smokeless tobacco: Never Used  Substance Use Topics  . Alcohol use: Yes    Alcohol/week: 7.0 - 10.0 standard drinks    Types: 6 - 8 Cans of beer, 1 - 2 Shots of liquor per week    Comment: Mostly on the weekends.     Review of Systems  Constitutional: Negative for chills, fatigue and fever.  HENT: Negative for congestion, ear pain, sinus pain and sore throat.    Eyes: Negative.   Respiratory: Negative for cough, shortness of breath and wheezing.   Cardiovascular: Negative for chest pain, palpitations and leg swelling.  Gastrointestinal: Negative for abdominal pain, diarrhea, nausea and vomiting.  Genitourinary: Negative for dysuria, frequency and urgency.  Musculoskeletal: Negative for arthralgias and myalgias.  Skin: Negative for color change, pallor and rash.  Neurological: Negative for syncope, light-headedness and headaches.  Psychiatric/Behavioral: The patient is not nervous/anxious.       Objective:   Physical Exam Vitals signs and nursing note reviewed.  Constitutional:      General: She is not in acute distress.    Appearance: She is not ill-appearing, toxic-appearing or diaphoretic.  HENT:     Head: Normocephalic and atraumatic.  Eyes:     General: No scleral icterus.    Extraocular Movements: Extraocular movements intact.     Conjunctiva/sclera: Conjunctivae normal.     Pupils: Pupils are equal, round, and reactive to light.  Neck:     Musculoskeletal: Normal range of motion and neck supple. No neck rigidity.     Vascular: No carotid bruit.  Cardiovascular:     Rate and Rhythm: Normal rate and regular rhythm.     Heart sounds: Normal heart sounds.  Pulmonary:     Effort: Pulmonary effort is normal. No respiratory distress.     Breath sounds: Normal breath sounds.  Musculoskeletal:  Right lower leg: No edema.     Left lower leg: No edema.  Skin:    General: Skin is warm and dry.     Coloration: Skin is not jaundiced or pale.  Neurological:     Mental Status: She is alert and oriented to person, place, and time.     Gait: Gait normal.  Psychiatric:        Mood and Affect: Mood normal.        Behavior: Behavior normal.    Today's Vitals   04/08/19 1109  BP: 98/72  Pulse: 79  Resp: 16  Temp: (!) 96.7 F (35.9 C)  TempSrc: Temporal  SpO2: 95%  Weight: 175 lb (79.4 kg)  Height: 5\' 1"  (1.549 m)   Body mass  index is 33.07 kg/m.     Assessment & Plan:    Essential hypertension-patient will continue off her current medications.  Refill sent to the pharmacy of her atenolol, amlodipine, hydrochlorothiazide.  Patient would like to hold off on any lab work at this time due to not currently having insurance.  Does plan to get lab work done soon once her new job begins.  Advised patient that if she is a protected veteran, she can get lab work, medications and cancer screenings all through the [place].  Hot flashes-patient's menopausal symptoms are well controlled with Lexapro.  Refill of Lexapro sent to pharmacy.  Patient will follow-up every 6 months for management of chronic conditions.  Advised patient that if she does have to change primary care providers based on insurance coverage is, to call office and let us know and we can coordinate sending any records for her.

## 2019-10-26 ENCOUNTER — Other Ambulatory Visit: Payer: Self-pay | Admitting: Family

## 2019-10-26 DIAGNOSIS — I10 Essential (primary) hypertension: Secondary | ICD-10-CM

## 2019-10-28 ENCOUNTER — Other Ambulatory Visit: Payer: Self-pay

## 2019-10-28 DIAGNOSIS — I10 Essential (primary) hypertension: Secondary | ICD-10-CM

## 2019-10-28 MED ORDER — HYDROCHLOROTHIAZIDE 25 MG PO TABS: 25.0000 mg | ORAL_TABLET | Freq: Every day | ORAL | 0 refills | Status: DC

## 2019-12-25 ENCOUNTER — Encounter: Payer: Self-pay | Admitting: Family

## 2019-12-26 NOTE — Telephone Encounter (Signed)
Please call pt and clarify - she donated blood and they checked her cholesterol.  Cholesterol is not usually checked when donate blood.  Need copy of labs.  Do recommend low cholesterol diet.  Also, needs a f/u appt and fasting lab to confirm cholesterol results - for example, good cholesterol, bad cholesterol, etc.

## 2019-12-27 NOTE — Telephone Encounter (Signed)
Attempted to call patient. Voicemail is not set up

## 2020-01-02 NOTE — Telephone Encounter (Signed)
Called patient for advice request. Shelley Massey states it was apart of a prescreening process to have her cholesterol checked. She will try to get the records over to [place]. Shelley Massey wanted to schedule for labs and scheduled for 01/08/20 for fasting labs to recheck her Cholesterol.

## 2020-01-07 ENCOUNTER — Telehealth: Payer: Self-pay

## 2020-01-07 ENCOUNTER — Other Ambulatory Visit: Payer: Self-pay

## 2020-01-07 DIAGNOSIS — I10 Essential (primary) hypertension: Secondary | ICD-10-CM

## 2020-01-07 DIAGNOSIS — E785 Hyperlipidemia, unspecified: Secondary | ICD-10-CM

## 2020-01-07 DIAGNOSIS — Z0000 Encounter for general adult medical examination without abnormal findings: Secondary | ICD-10-CM

## 2020-01-07 NOTE — Telephone Encounter (Signed)
Please use the "orders only" feature to order labs future

## 2020-01-07 NOTE — Telephone Encounter (Signed)
Understood, I ordered using Orders only.

## 2020-01-07 NOTE — Addendum Note (Signed)
Addended by: Ezequiel Ganser on: 01/07/2020 01:44 PM   Modules accepted: Orders

## 2020-01-07 NOTE — Telephone Encounter (Signed)
Orders are placed and ready for Lab appointment.

## 2020-01-08 ENCOUNTER — Other Ambulatory Visit (INDEPENDENT_AMBULATORY_CARE_PROVIDER_SITE_OTHER): Payer: Federal, State, Local not specified - PPO

## 2020-01-08 ENCOUNTER — Other Ambulatory Visit: Payer: Self-pay

## 2020-01-08 DIAGNOSIS — E785 Hyperlipidemia, unspecified: Secondary | ICD-10-CM

## 2020-01-08 DIAGNOSIS — I10 Essential (primary) hypertension: Secondary | ICD-10-CM

## 2020-01-08 LAB — COMPREHENSIVE METABOLIC PANEL
ALT: 16 U/L (ref 0–35)
AST: 15 U/L (ref 0–37)
Albumin: 4.5 g/dL (ref 3.5–5.2)
Alkaline Phosphatase: 68 U/L (ref 39–117)
BUN: 18 mg/dL (ref 6–23)
CO2: 27 mEq/L (ref 19–32)
Calcium: 9.5 mg/dL (ref 8.4–10.5)
Chloride: 103 mEq/L (ref 96–112)
Creatinine, Ser: 0.62 mg/dL (ref 0.40–1.20)
GFR: 101.25 mL/min (ref >60.00)
Glucose, Bld: 101 mg/dL — ABNORMAL HIGH (ref 70–99)
Potassium: 4.1 mEq/L (ref 3.5–5.1)
Sodium: 138 mEq/L (ref 135–145)
Total Bilirubin: 0.6 mg/dL (ref 0.2–1.2)
Total Protein: 7.4 g/dL (ref 6.0–8.3)

## 2020-01-08 LAB — LIPID PANEL
Cholesterol: 218 mg/dL — ABNORMAL HIGH (ref 0–200)
HDL: 44.9 mg/dL (ref >39.00)
LDL Cholesterol: 136 mg/dL — ABNORMAL HIGH (ref 0–99)
NonHDL: 173.43
Total CHOL/HDL Ratio: 5
Triglycerides: 189 mg/dL — ABNORMAL HIGH (ref 0.0–149.0)
VLDL: 37.8 mg/dL (ref 0.0–40.0)

## 2020-01-23 ENCOUNTER — Other Ambulatory Visit: Payer: Self-pay | Admitting: Family

## 2020-01-23 DIAGNOSIS — I10 Essential (primary) hypertension: Secondary | ICD-10-CM

## 2020-02-03 ENCOUNTER — Encounter: Payer: Self-pay | Admitting: Family

## 2020-02-03 ENCOUNTER — Ambulatory Visit (INDEPENDENT_AMBULATORY_CARE_PROVIDER_SITE_OTHER): Payer: Federal, State, Local not specified - PPO | Admitting: Family

## 2020-02-03 ENCOUNTER — Other Ambulatory Visit: Payer: Self-pay

## 2020-02-03 VITALS — BP 108/60 | HR 66 | Temp 98.8°F | Ht 60.98 in | Wt 164.8 lb

## 2020-02-03 DIAGNOSIS — Z1231 Encounter for screening mammogram for malignant neoplasm of breast: Secondary | ICD-10-CM

## 2020-02-03 DIAGNOSIS — R232 Flushing: Secondary | ICD-10-CM

## 2020-02-03 DIAGNOSIS — E785 Hyperlipidemia, unspecified: Secondary | ICD-10-CM | POA: Diagnosis not present

## 2020-02-03 DIAGNOSIS — I10 Essential (primary) hypertension: Secondary | ICD-10-CM

## 2020-02-03 NOTE — Assessment & Plan Note (Signed)
Doing well on Lexapro, continue.

## 2020-02-03 NOTE — Patient Instructions (Signed)
Lets focus on weight loss to lower your cholesterol. AVOID trans and saturated fats  We will follow your cholesterol   Please call  and schedule your 3D mammogram  as discussed.   [place]  [place], [place]   Nice to see you!

## 2020-02-03 NOTE — Progress Notes (Signed)
Subjective:    Patient ID: Shelley Massey, female    DOB: [birth year], 54 y.o.   MRN: [id number]  CC: Shelley Massey is a 54 y.o. female who presents today for follow up.    HPI: Would like this to be follow up and will return for CPE.  She would like CPE labs done today  Feels well today Working on [place] now and happy at work   HLD- taking omega 3. Working on [employer].   adopted, no known family h/o CVA, MI with biological brothers   Hot flashes- feels well on lexapro  No depression  Due mammogram  No known family h/o colon cancer. No changes in bowel habits. She would like to have cologuard done.   LDL 136   HISTORY:  Past Medical History:  Diagnosis Date  . Elevated LFTs   . Fatty liver   . Hay fever   . Hypertension   . Wears glasses     Past Surgical History:  Procedure Laterality Date  . CARPAL TUNNEL RELEASE Bilateral   . GANGLION CYST EXCISION Left 2015  . KNEE ARTHROSCOPY W/ MENISCAL REPAIR Right 2001   [place], s/p repair x 2  . MASS EXCISION Left 08/12/2013   Procedure: EXCISION VOLAR MASS LEFT WRIST AND THUMB;  Surgeon: Schuyler Amor, MD;  Location: [facility];  Service: Orthopedics;  Laterality: Left;  . TONSILLECTOMY    . TRIGGER FINGER RELEASE Left 02/24/2017   Procedure: MINOR RELEASE TRIGGER FINGER/A-1 PULLEY;  Surgeon: Charlotte Crumb, MD;  Location: [facility];  Service: Orthopedics;  Laterality: Left;  . WISDOM TOOTH EXTRACTION     Family History  Adopted: Yes  Problem Relation Age of Onset  . Hypertension Sister   . Colon cancer Neg Hx        from birth brothers      ALLERGIES: Patient has no known allergies.  Current Outpatient Medications on File Prior to Visit  Medication Sig Dispense Refill  . amLODipine (NORVASC) 10 MG tablet Take 1 tablet by mouth once daily 90 tablet 0  . aspirin 81 MG tablet Take 81 mg by mouth daily.    Marland Kitchen atenolol (TENORMIN) 50 MG tablet Take 1 tablet  (50 mg total) by mouth daily. 90 tablet 1  . cholecalciferol (VITAMIN D) 1000 UNITS tablet Take 1,000 Units by mouth daily.    Marland Kitchen escitalopram (LEXAPRO) 10 MG tablet Take 1 tablet (10 mg total) by mouth daily. 90 tablet 1  . Flaxseed, Linseed, (FLAXSEED OIL) 1000 MG CAPS Take 2 capsules by mouth daily.    . hydrochlorothiazide (HYDRODIURIL) 25 MG tablet Take 1 tablet by mouth once daily 90 tablet 0  . Omega-3 Fatty Acids (FISH OIL) 1200 MG CAPS Take 2 capsules by mouth daily.    . Vitamin E 400 UNITS TABS Take 2 tablets by mouth once daily 60 tablet 2   No current facility-administered medications on file prior to visit.    Social History   Tobacco Use  . Smoking status: Never Smoker  . Smokeless tobacco: Never Used  Substance Use Topics  . Alcohol use: Not Currently    Alcohol/week: 7.0 - 10.0 standard drinks    Types: 6 - 8 Cans of beer, 1 - 2 Shots of liquor per week    Comment: quit 01/2018  . Drug use: No    Review of Systems  Constitutional: Negative for chills and fever.  Respiratory: Negative for cough.   Cardiovascular:  Negative for chest pain and palpitations.  Gastrointestinal: Negative for nausea and vomiting.      Objective:    BP 108/60 (BP Location: Left Arm, Patient Position: Sitting)   Pulse 66   Temp 98.8 F (37.1 C)   Ht 5' 0.98" (1.549 m)   Wt 164 lb 12.8 oz (74.8 kg)   SpO2 96%   BMI 31.16 kg/m   BP Readings from Last 3 Encounters:  02/03/20 108/60  04/08/19 98/72  12/25/17 140/90   Wt Readings from Last 3 Encounters:  02/03/20 164 lb 12.8 oz (74.8 kg)  04/08/19 175 lb (79.4 kg)  12/25/17 140 lb 12 oz (63.8 kg)    Physical Exam Vitals reviewed.  Constitutional:      Appearance: She is well-developed.  Eyes:     Conjunctiva/sclera: Conjunctivae normal.  Cardiovascular:     Rate and Rhythm: Normal rate and regular rhythm.     Pulses: Normal pulses.     Heart sounds: Normal heart sounds.  Pulmonary:     Effort: Pulmonary effort is  normal.     Breath sounds: Normal breath sounds. No wheezing, rhonchi or rales.  Skin:    General: Skin is warm and dry.  Neurological:     Mental Status: She is alert.  Psychiatric:        Speech: Speech normal.        Behavior: Behavior normal.        Thought Content: Thought content normal.        Assessment & Plan:   Problem List Items Addressed This Visit      Cardiovascular and Mediastinum   Hot flashes    Doing well on Lexapro, continue.      Hypertension - Primary    Stable, continue regimen      Relevant Orders   Lipid panel   Hemoglobin A1c   TSH   VITAMIN D 25 Hydroxy (Vit-D Deficiency, Fractures)   Comprehensive metabolic panel     Other   HLD (hyperlipidemia)    LDL > 100.  Patient overall low cardiovascular risk and LDL is trending down. I expressed concern as we do not know family history ( she was adopted) and we could trial low dose cholesterol medication. At this gesture, patient working on weight loss and we opted to repeat cholesterol in 3 months to re-evaluate her risk.       Screening for breast cancer   Relevant Orders   MM 3D SCREEN BREAST BILATERAL     Of note, patient will schedule mammogram  I am having Mardene Celeste L. Shams maintain her Flaxseed Oil, Fish Oil, aspirin, Vitamin E, cholecalciferol, atenolol, escitalopram, amLODipine, and hydrochlorothiazide.   No orders of the defined types were placed in this encounter.   Return precautions given.   Risks, benefits, and alternatives of the medications and treatment plan prescribed today were discussed, and patient expressed understanding.   Education regarding symptom management and diagnosis given to patient on AVS.   Continue to follow with Burnard Hawthorne, FNP for routine health maintenance.   Ronita Hipps and I agreed with plan.   Mable Paris, FNP

## 2020-02-03 NOTE — Assessment & Plan Note (Signed)
Stable, continue regimen. 

## 2020-02-03 NOTE — Assessment & Plan Note (Signed)
LDL > 100.  Patient overall low cardiovascular risk and LDL is trending down. I expressed concern as we do not know family history ( she was adopted) and we could trial low dose cholesterol medication. At this gesture, patient working on weight loss and we opted to repeat cholesterol in 3 months to re-evaluate her risk.

## 2020-02-22 LAB — COLOGUARD: Cologuard: NEGATIVE

## 2020-02-28 LAB — EXTERNAL GENERIC LAB PROCEDURE: COLOGUARD: NEGATIVE

## 2020-02-28 LAB — COLOGUARD: COLOGUARD: NEGATIVE

## 2020-03-09 ENCOUNTER — Other Ambulatory Visit: Payer: Self-pay | Admitting: Family

## 2020-03-09 DIAGNOSIS — I10 Essential (primary) hypertension: Secondary | ICD-10-CM

## 2020-03-09 DIAGNOSIS — R232 Flushing: Secondary | ICD-10-CM

## 2020-03-13 ENCOUNTER — Telehealth: Payer: Self-pay | Admitting: Family

## 2020-03-13 NOTE — Telephone Encounter (Signed)
I went to [facility] & could see that patient's result was negative. Pt called & made aware.

## 2020-03-13 NOTE — Telephone Encounter (Signed)
Pt would like to know if we received results of cologuard test. States that they sent her a letter that they forwarded our office the results. Please advise

## 2020-03-18 ENCOUNTER — Encounter: Payer: Self-pay | Admitting: Family

## 2020-04-22 ENCOUNTER — Other Ambulatory Visit: Payer: Self-pay | Admitting: Family

## 2020-04-22 DIAGNOSIS — I10 Essential (primary) hypertension: Secondary | ICD-10-CM

## 2020-05-04 ENCOUNTER — Other Ambulatory Visit: Payer: Federal, State, Local not specified - PPO

## 2020-05-06 ENCOUNTER — Encounter: Payer: Federal, State, Local not specified - PPO | Admitting: Family

## 2020-06-10 ENCOUNTER — Other Ambulatory Visit: Payer: Self-pay | Admitting: Family

## 2020-06-10 DIAGNOSIS — I10 Essential (primary) hypertension: Secondary | ICD-10-CM

## 2020-06-10 DIAGNOSIS — L638 Other alopecia areata: Secondary | ICD-10-CM | POA: Diagnosis not present

## 2020-06-12 ENCOUNTER — Other Ambulatory Visit: Payer: Self-pay | Admitting: Family

## 2020-06-12 DIAGNOSIS — R232 Flushing: Secondary | ICD-10-CM

## 2020-07-14 ENCOUNTER — Encounter: Payer: Self-pay | Admitting: Family

## 2020-07-14 ENCOUNTER — Telehealth (INDEPENDENT_AMBULATORY_CARE_PROVIDER_SITE_OTHER): Payer: Federal, State, Local not specified - PPO | Admitting: Family

## 2020-07-14 ENCOUNTER — Other Ambulatory Visit: Payer: Self-pay

## 2020-07-14 VITALS — Ht 61.0 in | Wt 160.0 lb

## 2020-07-14 DIAGNOSIS — U071 COVID-19: Secondary | ICD-10-CM

## 2020-07-14 MED ORDER — AZITHROMYCIN 250 MG PO TABS: ORAL_TABLET | ORAL | 0 refills | Status: DC

## 2020-07-14 MED ORDER — BENZONATATE 100 MG PO CAPS: 100.0000 mg | ORAL_CAPSULE | Freq: Three times a day (TID) | ORAL | 1 refills | Status: DC | PRN

## 2020-07-14 NOTE — Progress Notes (Signed)
Verbal consent for services obtained from patient prior to services given to TELEPHONE visit:   Location of call:  provider at work patient at home  Names of all persons present for services: Mable Paris, NP and patient Chief complaint: covid positive 7 days ago  Complains of bodyaches, congestion, dry cough which started 8 days ago, unchanged.  Non bloody diarrhea approx 2-3 per day,occassional nausea. No abdominal pain, fever, sob, cp, rattle in chest.   Eating less due to diarrhea,however drinking a lot of water.   She is not covid vaccinated.  Never smoker.   Tried dayquil, sudafed, nyquil, mucinex, tylenol, ibuprofen, without relief.      A/P/next steps:  Problem List Items Addressed This Visit      Other   COVID-19 - Primary    Afebrile. No acute respiratory distress. Associated with GI symptoms. Will start azithromycin. Emphasized that antibiotics can worsen diarrhea and strict precautions to take with probiotics. Patient will remain in quarantine until symptoms largely resolved and completed 10 day quarantine.       Relevant Medications   azithromycin (ZITHROMAX Z-PAK) 250 MG tablet   benzonatate (TESSALON) 100 MG capsule       I spent 15 min  discussing plan of care over the phone.

## 2020-07-14 NOTE — Patient Instructions (Signed)
Start z pak Ensure to take probiotics while on antibiotics and also for 2 weeks after completion. It is important to re-colonize the gut with good bacteria and also to prevent any diarrheal infections associated with antibiotic use.    Purchase pulse oximeter to monitor oxygenation saturation. If you oxygen were to drop < 90%, this is an emergency and you may need oxygen support. You would need to seek in person evaluation at the emergency room or call 911.   Utilize over the counter medications to treat symptoms.  Please start Mucinex DM to take at bedtime for cough.     Please begin Vit C 1000 mg daily   Vitamin D3 4000 IU daily   Zinc 50 mg daily   Quercetin 250 mg -500 mg twice daily ; this an antioxidant which reduces inflammation. You can find it at places such as [place] and [employer] however not limited to these stores.   Once you are better, you may STOP all the above supplements.   Rest, hydrate very well, eat healthy protein food, Tylenol or Advil as directed .    Please go to Acute Care if symptoms worsen but are not an emergency.      Seek  treatment in the ED if respiratory issues/distress develops or unrelieved chest pain. Or, if you become severely weak, dehydrated, confused.     Given what we currently know about COVID-19 and the Omicron variant, CDC is shortening the recommended time for isolation for the public. People with COVID-19 should isolate for 5 days and if they are asymptomatic or their symptoms are resolving (without fever for 24 hours), follow that by 5 days of wearing a mask when around others to minimize the risk of infecting people they encounter. The change is motivated by science demonstrating that the majority of SARS-CoV-2 transmission occurs early in the course of illness, generally in the 1-2 days prior to onset of symptoms and the 2-3 days after.   Additionally, CDC is updating the recommended quarantine period for anyone in the general public  who is exposed to COVID-19. For people who are unvaccinated or are more than six months out from their second mRNA dose (or more than 2 months after the J&J vaccine) and not yet boosted, CDC now recommends quarantine for 5 days followed by strict mask use for an additional 5 days.  YOUR FAMILY or other household contacts, however , even if they feel fine , will need to continue to quarantining themselves  (that means no contact with ANYONE outside of the house)  for 14 days STARTING from the end of your initial 7 day period . (why? because they have been theoretically exposed to you during the entire 7 days of your illness, and they can sheD the virus without symptoms for that period of time ).         10 Things You Can Do to Manage Your COVID-19 Symptoms at Home If you have possible or confirmed COVID-19: 1. Stay home from work and school. And stay away from other public places. If you must go out, avoid using any kind of public transportation, ridesharing, or taxis. 2. Monitor your symptoms carefully. If your symptoms get worse, call your healthcare provider immediately. 3. Get rest and stay hydrated. 4. If you have a medical appointment, call the healthcare provider ahead of time and tell them that you have or may have COVID-19. 5. For medical emergencies, call 911 and notify the dispatch personnel that you have  or may have COVID-19. 6. Cover your cough and sneezes with a tissue or use the inside of your elbow. 7. Wash your hands often with soap and water for at least 20 seconds or clean your hands with an alcohol-based hand sanitizer that contains at least 60% alcohol. 8. As much as possible, stay in a specific room and away from other people in your home. Also, you should use a separate bathroom, if available. If you need to be around other people in or outside of the home, wear a mask. 9. Avoid sharing personal items with other people in your household, like dishes, towels, and  bedding. 10. Clean all surfaces that are touched often, like counters, tabletops, and doorknobs. Use household cleaning sprays or wipes according to the label instructions. [email or web address] 12/26/2018

## 2020-07-15 ENCOUNTER — Telehealth: Payer: Federal, State, Local not specified - PPO | Admitting: Family

## 2020-07-15 ENCOUNTER — Encounter: Payer: Self-pay | Admitting: Family

## 2020-07-15 ENCOUNTER — Telehealth: Payer: Self-pay | Admitting: Family

## 2020-07-15 ENCOUNTER — Telehealth: Payer: Federal, State, Local not specified - PPO | Admitting: Internal Medicine

## 2020-07-15 DIAGNOSIS — R110 Nausea: Secondary | ICD-10-CM

## 2020-07-15 MED ORDER — PROMETHAZINE HCL 12.5 MG PO TABS: 12.5000 mg | ORAL_TABLET | Freq: Three times a day (TID) | ORAL | 0 refills | Status: DC | PRN

## 2020-07-15 NOTE — Telephone Encounter (Signed)
Spoke with patient Complains of nausea and vomiting.  Non bloody bilious vomiting. Vomits after eats or drinks. Tried Zofran for nausea and vomited about 10 minutes later.  No fever, abdominal pain.  Drinking Pedialyte and didn't vomit.  Urine is pale yellow.   Advised: Trial of phenergan.  Small freq sips of water, ice chips, broth. Slowly advance diet If diarrhea doesn't improve or she develops fever, bloody emesis, advised she go to ED for IV fluids, labs.  She agrees with plan.

## 2020-07-16 NOTE — Assessment & Plan Note (Signed)
Afebrile. No acute respiratory distress. Associated with GI symptoms. Will start azithromycin. Emphasized that antibiotics can worsen diarrhea and strict precautions to take with probiotics. Patient will remain in quarantine until symptoms largely resolved and completed 10 day quarantine.

## 2020-07-20 ENCOUNTER — Encounter: Payer: Self-pay | Admitting: Family

## 2020-07-21 NOTE — Telephone Encounter (Signed)
Pt called and needs a return to work note from her having Covid.  Pt stated that she is not having any symptoms and has returned back to work  Please fax to [phone].. pt would also like a mychart sent

## 2020-07-24 ENCOUNTER — Other Ambulatory Visit: Payer: Self-pay | Admitting: Family

## 2020-07-24 DIAGNOSIS — I10 Essential (primary) hypertension: Secondary | ICD-10-CM

## 2020-07-27 ENCOUNTER — Other Ambulatory Visit: Payer: Self-pay | Admitting: Family

## 2020-07-27 DIAGNOSIS — I10 Essential (primary) hypertension: Secondary | ICD-10-CM

## 2020-07-28 ENCOUNTER — Other Ambulatory Visit: Payer: Federal, State, Local not specified - PPO

## 2020-07-31 ENCOUNTER — Encounter: Payer: Federal, State, Local not specified - PPO | Admitting: Family

## 2020-07-31 DIAGNOSIS — Z0289 Encounter for other administrative examinations: Secondary | ICD-10-CM

## 2020-09-05 ENCOUNTER — Other Ambulatory Visit: Payer: Self-pay | Admitting: Family

## 2020-09-05 DIAGNOSIS — I10 Essential (primary) hypertension: Secondary | ICD-10-CM

## 2020-09-05 DIAGNOSIS — R232 Flushing: Secondary | ICD-10-CM

## 2020-10-20 ENCOUNTER — Other Ambulatory Visit: Payer: Self-pay | Admitting: Family

## 2020-10-20 DIAGNOSIS — I10 Essential (primary) hypertension: Secondary | ICD-10-CM

## 2020-12-03 ENCOUNTER — Encounter: Payer: Self-pay | Admitting: *Deleted

## 2020-12-03 ENCOUNTER — Other Ambulatory Visit: Payer: Self-pay | Admitting: Family

## 2020-12-03 DIAGNOSIS — I10 Essential (primary) hypertension: Secondary | ICD-10-CM

## 2020-12-08 ENCOUNTER — Other Ambulatory Visit: Payer: Self-pay | Admitting: Family

## 2020-12-08 DIAGNOSIS — R232 Flushing: Secondary | ICD-10-CM

## 2020-12-11 ENCOUNTER — Ambulatory Visit: Payer: Federal, State, Local not specified - PPO | Admitting: Family

## 2021-01-03 ENCOUNTER — Other Ambulatory Visit: Payer: Self-pay | Admitting: Family

## 2021-01-03 DIAGNOSIS — I10 Essential (primary) hypertension: Secondary | ICD-10-CM

## 2021-01-11 ENCOUNTER — Encounter: Payer: Self-pay | Admitting: Family

## 2021-01-11 ENCOUNTER — Ambulatory Visit: Payer: Federal, State, Local not specified - PPO | Admitting: Family

## 2021-01-11 ENCOUNTER — Other Ambulatory Visit: Payer: Self-pay

## 2021-01-11 VITALS — BP 134/64 | HR 72 | Temp 98.6°F | Ht 61.0 in | Wt 165.2 lb

## 2021-01-11 DIAGNOSIS — Z1231 Encounter for screening mammogram for malignant neoplasm of breast: Secondary | ICD-10-CM

## 2021-01-11 DIAGNOSIS — R232 Flushing: Secondary | ICD-10-CM | POA: Diagnosis not present

## 2021-01-11 DIAGNOSIS — I10 Essential (primary) hypertension: Secondary | ICD-10-CM | POA: Diagnosis not present

## 2021-01-11 MED ORDER — ATENOLOL 50 MG PO TABS: ORAL_TABLET | ORAL | 3 refills | Status: DC

## 2021-01-11 NOTE — Assessment & Plan Note (Addendum)
Over all controlled. Continue hctz 25mg  , amlodipine 10mg , atenolol 50mg . Advised continued exercise and we will continue to closely monitor

## 2021-01-11 NOTE — Progress Notes (Signed)
Subjective:    Patient ID: Shelley Massey, female    DOB: Feb 17, 1966, 55 y.o.   MRN: [id number]  CC: Shelley Massey is a 55 y.o. female who presents today for follow up.   HPI: Feels well today. No complains.   Hot flashes - controlled with lexapro 10mg   No depression or anxiety  HTN- compliant with hctz 25mg  , amlodipine 10mg , atenolol 50mg     Hctz 25mg   HISTORY:  Past Medical History:  Diagnosis Date   Elevated LFTs    Fatty liver    Hay fever    Hypertension    Wears glasses    Past Surgical History:  Procedure Laterality Date   CARPAL TUNNEL RELEASE Bilateral    GANGLION CYST EXCISION Left 2015   KNEE ARTHROSCOPY W/ MENISCAL REPAIR Right 2001   [place], s/p repair x 2   MASS EXCISION Left 08/12/2013   Procedure: EXCISION VOLAR MASS LEFT WRIST AND THUMB;  Surgeon: Schuyler Amor, MD;  Location: [facility];  Service: Orthopedics;  Laterality: Left;   TONSILLECTOMY     TRIGGER FINGER RELEASE Left 02/24/2017   Procedure: MINOR RELEASE TRIGGER FINGER/A-1 PULLEY;  Surgeon: Charlotte Crumb, MD;  Location: [facility];  Service: Orthopedics;  Laterality: Left;   WISDOM TOOTH EXTRACTION     Family History  Adopted: Yes  Problem Relation Age of Onset   Hypertension Sister    Colon cancer Neg Hx        from birth brothers    Allergies: Patient has no known allergies. Current Outpatient Medications on File Prior to Visit  Medication Sig Dispense Refill   amLODipine (NORVASC) 10 MG tablet Take 1 tablet by mouth once daily 90 tablet 0   aspirin 81 MG tablet Take 81 mg by mouth daily.     cholecalciferol (VITAMIN D) 1000 UNITS tablet Take 1,000 Units by mouth daily.     escitalopram (LEXAPRO) 10 MG tablet Take 1 tablet by mouth once daily 90 tablet 0   Flaxseed, Linseed, (FLAXSEED OIL) 1000 MG CAPS Take 2 capsules by mouth daily.     hydrochlorothiazide (HYDRODIURIL) 25 MG tablet Take 1 tablet by mouth once daily 90 tablet 0   Omega-3  Fatty Acids (FISH OIL) 1200 MG CAPS Take 2 capsules by mouth daily.     Vitamin E 400 UNITS TABS Take 2 tablets by mouth once daily 60 tablet 2   No current facility-administered medications on file prior to visit.    Social History   Tobacco Use   Smoking status: Never   Smokeless tobacco: Never  Substance Use Topics   Alcohol use: Not Currently    Alcohol/week: 7.0 - 10.0 standard drinks    Types: 6 - 8 Cans of beer, 1 - 2 Shots of liquor per week    Comment: quit 01/2018   Drug use: No    Review of Systems  Constitutional:  Negative for chills and fever.  Respiratory:  Negative for cough.   Cardiovascular:  Negative for chest pain and palpitations.  Gastrointestinal:  Negative for nausea and vomiting.     Objective:    BP 134/64 (BP Location: Left Arm, Patient Position: Sitting, Cuff Size: Large)   Pulse 72   Temp 98.6 F (37 C) (Oral)   Ht 5\' 1"  (1.549 m)   Wt 165 lb 3.2 oz (74.9 kg)   SpO2 97%   BMI 31.21 kg/m  BP Readings from Last 3 Encounters:  01/11/21 134/64  02/03/20 108/60  04/08/19 98/72   Wt Readings from Last 3 Encounters:  01/11/21 165 lb 3.2 oz (74.9 kg)  07/14/20 160 lb (72.6 kg)  02/03/20 164 lb 12.8 oz (74.8 kg)    Physical Exam Vitals reviewed.  Constitutional:      Appearance: She is well-developed.  Eyes:     Conjunctiva/sclera: Conjunctivae normal.  Cardiovascular:     Rate and Rhythm: Normal rate and regular rhythm.     Pulses: Normal pulses.     Heart sounds: Normal heart sounds.  Pulmonary:     Effort: Pulmonary effort is normal.     Breath sounds: Normal breath sounds. No wheezing, rhonchi or rales.  Skin:    General: Skin is warm and dry.  Neurological:     Mental Status: She is alert.  Psychiatric:        Speech: Speech normal.        Behavior: Behavior normal.        Thought Content: Thought content normal.       Assessment & Plan:   Problem List Items Addressed This Visit       Cardiovascular and Mediastinum    Hot flashes    Chronic, stable. Continue lexapro 10mg .        Relevant Medications   atenolol (TENORMIN) 50 MG tablet   Hypertension    Over all controlled. Continue hctz 25mg  , amlodipine 10mg , atenolol 50mg . Advised continued exercise and we will continue to closely monitor         Relevant Medications   atenolol (TENORMIN) 50 MG tablet     Other   Screening for breast cancer - Primary   Relevant Orders   MM 3D SCREEN BREAST BILATERAL   Other Visit Diagnoses     Essential hypertension       Relevant Medications   atenolol (TENORMIN) 50 MG tablet   Other Relevant Orders   Comprehensive metabolic panel   CBC with Differential/Platelet   Hemoglobin A1c   Lipid panel   TSH   VITAMIN D 25 Hydroxy (Vit-D Deficiency, Fractures)        I have discontinued Shelley Massey's azithromycin, benzonatate, and promethazine. I am also having her maintain her Flaxseed Oil, Fish Oil, aspirin, Vitamin E, cholecalciferol, hydrochlorothiazide, amLODipine, escitalopram, and atenolol.   Meds ordered this encounter  Medications   atenolol (TENORMIN) 50 MG tablet    Sig: TAKE 1 TABLET BY MOUTH ONCE DAILY. APPOINTMENT NEEDED FOR FURTHER REFILLS.    Dispense:  90 tablet    Refill:  3    Order Specific Question:   Supervising Provider    Answer:   Crecencio Mc [[id number]]    Return precautions given.   Risks, benefits, and alternatives of the medications and treatment plan prescribed today were discussed, and patient expressed understanding.   Education regarding symptom management and diagnosis given to patient on AVS.  Continue to follow with Shelley Hawthorne, FNP for routine health maintenance.   Ronita Hipps and I agreed with plan.   Mable Paris, FNP

## 2021-01-11 NOTE — Assessment & Plan Note (Signed)
Chronic, stable.  Continue lexapro 10 mg 

## 2021-01-14 ENCOUNTER — Other Ambulatory Visit (INDEPENDENT_AMBULATORY_CARE_PROVIDER_SITE_OTHER): Payer: Federal, State, Local not specified - PPO

## 2021-01-14 ENCOUNTER — Other Ambulatory Visit: Payer: Self-pay

## 2021-01-14 DIAGNOSIS — I10 Essential (primary) hypertension: Secondary | ICD-10-CM

## 2021-01-14 LAB — COMPREHENSIVE METABOLIC PANEL
ALT: 22 U/L (ref 0–35)
AST: 18 U/L (ref 0–37)
Albumin: 4.6 g/dL (ref 3.5–5.2)
Alkaline Phosphatase: 63 U/L (ref 39–117)
BUN: 13 mg/dL (ref 6–23)
CO2: 26 mEq/L (ref 19–32)
Calcium: 9.7 mg/dL (ref 8.4–10.5)
Chloride: 101 mEq/L (ref 96–112)
Creatinine, Ser: 0.68 mg/dL (ref 0.40–1.20)
GFR: 100.01 mL/min (ref >60.00)
Glucose, Bld: 91 mg/dL (ref 70–99)
Potassium: 4.3 mEq/L (ref 3.5–5.1)
Sodium: 139 mEq/L (ref 135–145)
Total Bilirubin: 0.4 mg/dL (ref 0.2–1.2)
Total Protein: 7.7 g/dL (ref 6.0–8.3)

## 2021-01-14 LAB — CBC WITH DIFFERENTIAL/PLATELET
Basophils Absolute: 0 10*3/uL (ref 0.0–0.1)
Basophils Relative: 0.8 % (ref 0.0–3.0)
Eosinophils Absolute: 0.1 10*3/uL (ref 0.0–0.7)
Eosinophils Relative: 1.3 % (ref 0.0–5.0)
HCT: 44.2 % (ref 36.0–46.0)
Hemoglobin: 14.3 g/dL (ref 12.0–15.0)
Lymphocytes Relative: 44.5 % (ref 12.0–46.0)
Lymphs Abs: 2 10*3/uL (ref 0.7–4.0)
MCHC: 32.3 g/dL (ref 30.0–36.0)
MCV: 91.6 fl (ref 78.0–100.0)
Monocytes Absolute: 0.3 10*3/uL (ref 0.1–1.0)
Monocytes Relative: 7.1 % (ref 3.0–12.0)
Neutro Abs: 2.1 10*3/uL (ref 1.4–7.7)
Neutrophils Relative %: 46.3 % (ref 43.0–77.0)
Platelets: 349 10*3/uL (ref 150.0–400.0)
RBC: 4.83 Mil/uL (ref 3.87–5.11)
RDW: 13.3 % (ref 11.5–15.5)
WBC: 4.5 10*3/uL (ref 4.0–10.5)

## 2021-01-14 LAB — TSH: TSH: 16.9 u[IU]/mL — ABNORMAL HIGH (ref 0.35–5.50)

## 2021-01-14 LAB — LIPID PANEL
Cholesterol: 316 mg/dL — ABNORMAL HIGH (ref 0–200)
HDL: 54.1 mg/dL (ref >39.00)
NonHDL: 261.87
Total CHOL/HDL Ratio: 6
Triglycerides: 211 mg/dL — ABNORMAL HIGH (ref 0.0–149.0)
VLDL: 42.2 mg/dL — ABNORMAL HIGH (ref 0.0–40.0)

## 2021-01-14 LAB — LDL CHOLESTEROL, DIRECT: Direct LDL: 208 mg/dL

## 2021-01-14 LAB — HEMOGLOBIN A1C: Hgb A1c MFr Bld: 5.4 % (ref 4.6–6.5)

## 2021-01-14 LAB — VITAMIN D 25 HYDROXY (VIT D DEFICIENCY, FRACTURES): VITD: 35.74 ng/mL (ref 30.00–100.00)

## 2021-01-18 ENCOUNTER — Other Ambulatory Visit: Payer: Self-pay | Admitting: Family

## 2021-01-18 DIAGNOSIS — R7989 Other specified abnormal findings of blood chemistry: Secondary | ICD-10-CM

## 2021-01-18 DIAGNOSIS — I10 Essential (primary) hypertension: Secondary | ICD-10-CM

## 2021-01-19 ENCOUNTER — Other Ambulatory Visit: Payer: Self-pay

## 2021-01-19 ENCOUNTER — Encounter: Payer: Self-pay | Admitting: Family

## 2021-01-19 DIAGNOSIS — E785 Hyperlipidemia, unspecified: Secondary | ICD-10-CM

## 2021-01-19 MED ORDER — ROSUVASTATIN CALCIUM 10 MG PO TABS: 10.0000 mg | ORAL_TABLET | Freq: Every evening | ORAL | 3 refills | Status: DC

## 2021-01-19 NOTE — Telephone Encounter (Signed)
I spoke with patient & thyroid labs scheduled in 2 weeks & cmp in 6 weeks. Pt knows that Crestor '10mg'$  sent to pharmacy to take every evening.

## 2021-01-21 ENCOUNTER — Other Ambulatory Visit: Payer: Self-pay

## 2021-01-21 ENCOUNTER — Ambulatory Visit
Admission: RE | Admit: 2021-01-21 | Discharge: 2021-01-21 | Disposition: A | Discharge status: Home / Self Care | Payer: Federal, State, Local not specified - PPO | Source: Ambulatory Visit | Attending: Family | Admitting: Family

## 2021-01-21 DIAGNOSIS — Z1231 Encounter for screening mammogram for malignant neoplasm of breast: Secondary | ICD-10-CM | POA: Insufficient documentation

## 2021-02-02 ENCOUNTER — Other Ambulatory Visit: Payer: Self-pay

## 2021-02-02 ENCOUNTER — Other Ambulatory Visit (INDEPENDENT_AMBULATORY_CARE_PROVIDER_SITE_OTHER): Payer: Federal, State, Local not specified - PPO

## 2021-02-02 DIAGNOSIS — R7989 Other specified abnormal findings of blood chemistry: Secondary | ICD-10-CM | POA: Diagnosis not present

## 2021-02-02 LAB — TSH: TSH: 4.94 u[IU]/mL (ref 0.35–5.50)

## 2021-02-02 LAB — T4, FREE: Free T4: 0.66 ng/dL (ref 0.60–1.60)

## 2021-02-02 LAB — T3, FREE: T3, Free: 3.7 pg/mL (ref 2.3–4.2)

## 2021-03-08 ENCOUNTER — Other Ambulatory Visit: Payer: Self-pay

## 2021-03-08 ENCOUNTER — Other Ambulatory Visit (INDEPENDENT_AMBULATORY_CARE_PROVIDER_SITE_OTHER): Payer: Federal, State, Local not specified - PPO

## 2021-03-08 DIAGNOSIS — E785 Hyperlipidemia, unspecified: Secondary | ICD-10-CM

## 2021-03-08 LAB — COMPREHENSIVE METABOLIC PANEL
ALT: 22 U/L (ref 0–35)
AST: 16 U/L (ref 0–37)
Albumin: 4.6 g/dL (ref 3.5–5.2)
Alkaline Phosphatase: 62 U/L (ref 39–117)
BUN: 29 mg/dL — ABNORMAL HIGH (ref 6–23)
CO2: 25 mEq/L (ref 19–32)
Calcium: 9.9 mg/dL (ref 8.4–10.5)
Chloride: 98 mEq/L (ref 96–112)
Creatinine, Ser: 0.72 mg/dL (ref 0.40–1.20)
GFR: 95.91 mL/min (ref >60.00)
Glucose, Bld: 121 mg/dL — ABNORMAL HIGH (ref 70–99)
Potassium: 3.6 mEq/L (ref 3.5–5.1)
Sodium: 137 mEq/L (ref 135–145)
Total Bilirubin: 0.4 mg/dL (ref 0.2–1.2)
Total Protein: 7.8 g/dL (ref 6.0–8.3)

## 2021-03-13 ENCOUNTER — Other Ambulatory Visit: Payer: Self-pay | Admitting: Family

## 2021-03-13 DIAGNOSIS — R232 Flushing: Secondary | ICD-10-CM

## 2021-03-15 ENCOUNTER — Telehealth: Payer: Self-pay | Admitting: Family

## 2021-03-15 ENCOUNTER — Encounter: Payer: Self-pay | Admitting: Family

## 2021-03-15 ENCOUNTER — Other Ambulatory Visit (HOSPITAL_COMMUNITY)
Admission: RE | Admit: 2021-03-15 | Discharge: 2021-03-15 | Disposition: A | Discharge status: Home / Self Care | Payer: Federal, State, Local not specified - PPO | Source: Ambulatory Visit | Attending: Family | Admitting: Family

## 2021-03-15 ENCOUNTER — Ambulatory Visit (INDEPENDENT_AMBULATORY_CARE_PROVIDER_SITE_OTHER): Payer: Federal, State, Local not specified - PPO | Admitting: Family

## 2021-03-15 ENCOUNTER — Other Ambulatory Visit: Payer: Self-pay

## 2021-03-15 VITALS — BP 118/72 | HR 64 | Temp 98.7°F | Ht 61.0 in | Wt 166.2 lb

## 2021-03-15 DIAGNOSIS — Z0000 Encounter for general adult medical examination without abnormal findings: Secondary | ICD-10-CM

## 2021-03-15 NOTE — Progress Notes (Signed)
Subjective:    Patient ID: Shelley Massey, female    DOB: 1966/05/12, 55 y.o.   MRN: [id number]  CC: Shelley Massey is a 55 y.o. female who presents today for physical exam.    HPI: Feels well today No new complaints    Colorectal Cancer Screening: she has done cologuard test in the past 3 years.  Breast Cancer Screening: Mammogram UTD Cervical Cancer Screening: due.  No pelvic pain.         Tetanus - utd         Labs: Screening labs done prior Exercise: Gets regular exercise, 12000 steps most days on work.  Alcohol use:  none Smoking/tobacco use: Nonsmoker.     HISTORY:  Past Medical History:  Diagnosis Date   Elevated LFTs    Fatty liver    Hay fever    Hypertension    Wears glasses     Past Surgical History:  Procedure Laterality Date   CARPAL TUNNEL RELEASE Bilateral    GANGLION CYST EXCISION Left 2015   KNEE ARTHROSCOPY W/ MENISCAL REPAIR Right 2001   [place], s/p repair x 2   MASS EXCISION Left 08/12/2013   Procedure: EXCISION VOLAR MASS LEFT WRIST AND THUMB;  Surgeon: Schuyler Amor, MD;  Location: [facility];  Service: Orthopedics;  Laterality: Left;   TONSILLECTOMY     TRIGGER FINGER RELEASE Left 02/24/2017   Procedure: MINOR RELEASE TRIGGER FINGER/A-1 PULLEY;  Surgeon: Charlotte Crumb, MD;  Location: [facility];  Service: Orthopedics;  Laterality: Left;   WISDOM TOOTH EXTRACTION     Family History  Adopted: Yes  Problem Relation Age of Onset   Hypertension Sister    Colon cancer Neg Hx        from birth brothers      ALLERGIES: Patient has no known allergies.  Current Outpatient Medications on File Prior to Visit  Medication Sig Dispense Refill   amLODipine (NORVASC) 10 MG tablet Take 1 tablet by mouth once daily 90 tablet 0   aspirin 81 MG tablet Take 81 mg by mouth daily.     atenolol (TENORMIN) 50 MG tablet TAKE 1 TABLET BY MOUTH ONCE DAILY. APPOINTMENT NEEDED FOR FURTHER REFILLS. 90 tablet 3    cholecalciferol (VITAMIN D) 1000 UNITS tablet Take 1,000 Units by mouth daily.     escitalopram (LEXAPRO) 10 MG tablet Take 1 tablet by mouth once daily 90 tablet 0   Flaxseed, Linseed, (FLAXSEED OIL) 1000 MG CAPS Take 2 capsules by mouth daily.     hydrochlorothiazide (HYDRODIURIL) 25 MG tablet Take 1 tablet by mouth once daily 90 tablet 0   Omega-3 Fatty Acids (FISH OIL) 1200 MG CAPS Take 2 capsules by mouth daily.     rosuvastatin (CRESTOR) 10 MG tablet Take 1 tablet (10 mg total) by mouth every evening. 90 tablet 3   Vitamin E 400 UNITS TABS Take 2 tablets by mouth once daily 60 tablet 2   No current facility-administered medications on file prior to visit.    Social History   Tobacco Use   Smoking status: Never   Smokeless tobacco: Never  Substance Use Topics   Alcohol use: Not Currently    Alcohol/week: 7.0 - 10.0 standard drinks    Types: 6 - 8 Cans of beer, 1 - 2 Shots of liquor per week    Comment: quit 01/2018   Drug use: No    Review of Systems  Constitutional:  Negative for chills and  fever.  Respiratory:  Negative for cough.   Cardiovascular:  Negative for chest pain and palpitations.  Gastrointestinal:  Negative for nausea and vomiting.     Objective:    BP 118/72 (BP Location: Left Arm, Patient Position: Sitting, Cuff Size: Normal)   Pulse 64   Temp 98.7 F (37.1 C) (Oral)   Ht '5\' 1"'$  (1.549 m)   Wt 166 lb 3.2 oz (75.4 kg)   SpO2 96%   BMI 31.40 kg/m   BP Readings from Last 3 Encounters:  03/15/21 118/72  01/11/21 134/64  02/03/20 108/60   Wt Readings from Last 3 Encounters:  03/15/21 166 lb 3.2 oz (75.4 kg)  01/11/21 165 lb 3.2 oz (74.9 kg)  07/14/20 160 lb (72.6 kg)    Physical Exam Vitals reviewed.  Constitutional:      Appearance: She is well-developed.  Eyes:     Conjunctiva/sclera: Conjunctivae normal.  Cardiovascular:     Rate and Rhythm: Normal rate and regular rhythm.     Pulses: Normal pulses.     Heart sounds: Normal heart sounds.   Pulmonary:     Effort: Pulmonary effort is normal.     Breath sounds: Normal breath sounds. No wheezing, rhonchi or rales.  Skin:    General: Skin is warm and dry.  Neurological:     Mental Status: She is alert.  Psychiatric:        Speech: Speech normal.        Behavior: Behavior normal.        Thought Content: Thought content normal.       Assessment & Plan:   Problem List Items Addressed This Visit       Other   Routine general medical examination at a health care facility - Primary    Clinical breast exam performed today.  Pap smear collected.  Patient reports Cologuard is up-to-date and Judson Roch CMA will call Cologuard to confirm this.  We will communicate this with patient.  Encouraged continued exercise.      Relevant Orders   Cytology - PAP( [place] [facility])     I am having Shelley Massey maintain her Flaxseed Oil, Fish Oil, aspirin, Vitamin E, cholecalciferol, atenolol, amLODipine, hydrochlorothiazide, rosuvastatin, and escitalopram.   No orders of the defined types were placed in this encounter.   Return precautions given.   Risks, benefits, and alternatives of the medications and treatment plan prescribed today were discussed, and patient expressed understanding.   Education regarding symptom management and diagnosis given to patient on AVS.   Continue to follow with Burnard Hawthorne, FNP for routine health maintenance.   Shelley Massey and I agreed with plan.   Shelley Paris, FNP

## 2021-03-15 NOTE — Assessment & Plan Note (Signed)
Clinical breast exam performed today.  Pap smear collected.  Patient reports Cologuard is up-to-date and Judson Roch CMA will call Cologuard to confirm this.  We will communicate this with patient.  Encouraged continued exercise.

## 2021-03-15 NOTE — Patient Instructions (Signed)
Nice to see you!  Health Maintenance for Postmenopausal Women Menopause is a normal process in which your ability to get pregnant comes to an end. This process happens slowly over many months or years, usually between the ages of 69 and 21. Menopause is complete when you have missed your menstrual periods for 12 months. It is important to talk with your health care provider about some of the most common conditions that affect women after menopause (postmenopausal women). These include heart disease, cancer, and bone loss (osteoporosis). Adopting a healthy lifestyle and getting preventive care can help to promote your health and wellness. The actions you take can also lower your chances of developing some of these common conditions. What should I know about menopause? During menopause, you may get a number of symptoms, such as: Hot flashes. These can be moderate or severe. Night sweats. Decrease in sex drive. Mood swings. Headaches. Tiredness. Irritability. Memory problems. Insomnia. Choosing to treat or not to treat these symptoms is a decision that you make with your health care provider. Do I need hormone replacement therapy? Hormone replacement therapy is effective in treating symptoms that are caused by menopause, such as hot flashes and night sweats. Hormone replacement carries certain risks, especially as you become older. If you are thinking about using estrogen or estrogen with progestin, discuss the benefits and risks with your health care provider. What is my risk for heart disease and stroke? The risk of heart disease, heart attack, and stroke increases as you age. One of the causes may be a change in the body's hormones during menopause. This can affect how your body uses dietary fats, triglycerides, and cholesterol. Heart attack and stroke are medical emergencies. There are many things that you can do to help prevent heart disease and stroke. Watch your blood pressure High blood  pressure causes heart disease and increases the risk of stroke. This is more likely to develop in people who have high blood pressure readings, are of African descent, or are overweight. Have your blood pressure checked: Every 3-5 years if you are 29-87 years of age. Every year if you are 61 years old or older. Eat a healthy diet  Eat a diet that includes plenty of vegetables, fruits, low-fat dairy products, and lean protein. Do not eat a lot of foods that are high in solid fats, added sugars, or sodium. Get regular exercise Get regular exercise. This is one of the most important things you can do for your health. Most adults should: Try to exercise for at least 150 minutes each week. The exercise should increase your heart rate and make you sweat (moderate-intensity exercise). Try to do strengthening exercises at least twice each week. Do these in addition to the moderate-intensity exercise. Spend less time sitting. Even light physical activity can be beneficial. Other tips Work with your health care provider to achieve or maintain a healthy weight. Do not use any products that contain nicotine or tobacco, such as cigarettes, e-cigarettes, and chewing tobacco. If you need help quitting, ask your health care provider. Know your numbers. Ask your health care provider to check your cholesterol and your blood sugar (glucose). Continue to have your blood tested as directed by your health care provider. Do I need screening for cancer? Depending on your health history and family history, you may need to have cancer screening at different stages of your life. This may include screening for: Breast cancer. Cervical cancer. Lung cancer. Colorectal cancer. What is my risk for osteoporosis?  After menopause, you may be at increased risk for osteoporosis. Osteoporosis is a condition in which bone destruction happens more quickly than new bone creation. To help prevent osteoporosis or the bone fractures  that can happen because of osteoporosis, you may take the following actions: If you are 61-65 years old, get at least 1,000 mg of calcium and at least 600 mg of vitamin D per day. If you are older than age 78 but younger than age 71, get at least 1,200 mg of calcium and at least 600 mg of vitamin D per day. If you are older than age 82, get at least 1,200 mg of calcium and at least 800 mg of vitamin D per day. Smoking and drinking excessive alcohol increase the risk of osteoporosis. Eat foods that are rich in calcium and vitamin D, and do weight-bearing exercises several times each week as directed by your health care provider. How does menopause affect my mental health? Depression may occur at any age, but it is more common as you become older. Common symptoms of depression include: Low or sad mood. Changes in sleep patterns. Changes in appetite or eating patterns. Feeling an overall lack of motivation or enjoyment of activities that you previously enjoyed. Frequent crying spells. Talk with your health care provider if you think that you are experiencing depression. General instructions See your health care provider for regular wellness exams and vaccines. This may include: Scheduling regular health, dental, and eye exams. Getting and maintaining your vaccines. These include: Influenza vaccine. Get this vaccine each year before the flu season begins. Pneumonia vaccine. Shingles vaccine. Tetanus, diphtheria, and pertussis (Tdap) booster vaccine. Your health care provider may also recommend other immunizations. Tell your health care provider if you have ever been abused or do not feel safe at home. Summary Menopause is a normal process in which your ability to get pregnant comes to an end. This condition causes hot flashes, night sweats, decreased interest in sex, mood swings, headaches, or lack of sleep. Treatment for this condition may include hormone replacement therapy. Take actions to  keep yourself healthy, including exercising regularly, eating a healthy diet, watching your weight, and checking your blood pressure and blood sugar levels. Get screened for cancer and depression. Make sure that you are up to date with all your vaccines. This information is not intended to replace advice given to you by your health care provider. Make sure you discuss any questions you have with your health care provider. Document Revised: 06/06/2018 Document Reviewed: 06/06/2018 Elsevier Patient Education  2022 [employer].

## 2021-03-15 NOTE — Telephone Encounter (Signed)
Call cologuard Pt reports cologuard within the last 3 years  Please get results, abstract and  let pt know we have done so

## 2021-03-16 NOTE — Telephone Encounter (Signed)
Just FYI I spoke with Shelley Massey from [facility] & he did see order from 01/2020. Apparently we also ordered for patient as well. He will fax over the negative results so that I can abstract into the chart.

## 2021-03-17 LAB — CYTOLOGY - PAP
Comment: NEGATIVE
Diagnosis: NEGATIVE
High risk HPV: NEGATIVE

## 2021-03-22 NOTE — Telephone Encounter (Signed)
Did we receive cologuard?

## 2021-03-23 NOTE — Telephone Encounter (Signed)
Yes & was negative. I have placed in your folder. I have also abstracted into her chart.

## 2021-04-16 ENCOUNTER — Other Ambulatory Visit: Payer: Self-pay | Admitting: Family

## 2021-04-16 DIAGNOSIS — I10 Essential (primary) hypertension: Secondary | ICD-10-CM

## 2021-04-22 ENCOUNTER — Encounter: Payer: Self-pay | Admitting: Family

## 2021-06-12 ENCOUNTER — Other Ambulatory Visit: Payer: Self-pay | Admitting: Family

## 2021-06-12 DIAGNOSIS — R232 Flushing: Secondary | ICD-10-CM

## 2021-06-14 ENCOUNTER — Ambulatory Visit: Payer: Federal, State, Local not specified - PPO | Admitting: Family

## 2021-07-19 ENCOUNTER — Other Ambulatory Visit: Payer: Self-pay | Admitting: Family

## 2021-07-19 DIAGNOSIS — I10 Essential (primary) hypertension: Secondary | ICD-10-CM

## 2021-08-09 ENCOUNTER — Ambulatory Visit: Payer: Federal, State, Local not specified - PPO | Admitting: Family

## 2021-09-15 ENCOUNTER — Other Ambulatory Visit: Payer: Self-pay | Admitting: Family

## 2021-09-15 DIAGNOSIS — R232 Flushing: Secondary | ICD-10-CM

## 2021-10-09 ENCOUNTER — Other Ambulatory Visit: Payer: Self-pay | Admitting: Family

## 2021-10-09 DIAGNOSIS — I10 Essential (primary) hypertension: Secondary | ICD-10-CM

## 2021-12-08 ENCOUNTER — Other Ambulatory Visit: Payer: Self-pay | Admitting: Family

## 2021-12-08 DIAGNOSIS — R232 Flushing: Secondary | ICD-10-CM

## 2021-12-21 ENCOUNTER — Other Ambulatory Visit: Payer: Self-pay

## 2021-12-21 DIAGNOSIS — R232 Flushing: Secondary | ICD-10-CM

## 2021-12-21 MED ORDER — ESCITALOPRAM OXALATE 10 MG PO TABS: 10.0000 mg | ORAL_TABLET | Freq: Every day | ORAL | 3 refills | Status: DC

## 2021-12-22 ENCOUNTER — Encounter: Payer: Self-pay | Admitting: Family

## 2021-12-22 NOTE — Telephone Encounter (Signed)
Spoke to patient and advised her that she needs an in person visit.  I could not offer her a visit here because we have no openings for tomorrow. I advised her to go to urgent care or ed today. Patient stated that she would go and get checked out today or first thing in the morning.

## 2021-12-24 NOTE — Telephone Encounter (Signed)
LVM to call back to office  

## 2021-12-29 ENCOUNTER — Telehealth: Payer: Self-pay

## 2021-12-29 NOTE — Telephone Encounter (Signed)
Lvm to call back to office to see if she had been seen in UC or the ED and to schedule f/u appt here in the office

## 2021-12-29 NOTE — Telephone Encounter (Signed)
Called and LVM to call back to office to schedule follow up and to check and see if she had went to [facility] or ED to be seen.

## 2022-01-04 NOTE — Telephone Encounter (Signed)
Spoke to patient and she had not been seen but was feeling better and got her scheduled for 01/24/22 for f/u

## 2022-01-17 ENCOUNTER — Other Ambulatory Visit: Payer: Self-pay | Admitting: Family

## 2022-01-17 DIAGNOSIS — I10 Essential (primary) hypertension: Secondary | ICD-10-CM

## 2022-01-20 ENCOUNTER — Other Ambulatory Visit: Payer: Self-pay | Admitting: Family

## 2022-01-24 ENCOUNTER — Ambulatory Visit: Payer: Federal, State, Local not specified - PPO | Admitting: Family

## 2022-01-26 ENCOUNTER — Encounter: Payer: Self-pay | Admitting: Family

## 2022-01-26 ENCOUNTER — Ambulatory Visit: Payer: Federal, State, Local not specified - PPO | Admitting: Family

## 2022-01-26 VITALS — BP 118/68 | HR 66 | Temp 97.8°F | Ht 61.0 in | Wt 168.6 lb

## 2022-01-26 DIAGNOSIS — H6090 Unspecified otitis externa, unspecified ear: Secondary | ICD-10-CM | POA: Insufficient documentation

## 2022-01-26 DIAGNOSIS — I10 Essential (primary) hypertension: Secondary | ICD-10-CM | POA: Diagnosis not present

## 2022-01-26 DIAGNOSIS — H60501 Unspecified acute noninfective otitis externa, right ear: Secondary | ICD-10-CM | POA: Diagnosis not present

## 2022-01-26 DIAGNOSIS — E785 Hyperlipidemia, unspecified: Secondary | ICD-10-CM

## 2022-01-26 LAB — COMPREHENSIVE METABOLIC PANEL
ALT: 25 U/L (ref 0–35)
AST: 19 U/L (ref 0–37)
Albumin: 4.7 g/dL (ref 3.5–5.2)
Alkaline Phosphatase: 72 U/L (ref 39–117)
BUN: 13 mg/dL (ref 6–23)
CO2: 27 mEq/L (ref 19–32)
Calcium: 9.5 mg/dL (ref 8.4–10.5)
Chloride: 102 mEq/L (ref 96–112)
Creatinine, Ser: 0.67 mg/dL (ref 0.40–1.20)
GFR: 99.64 mL/min (ref >60.00)
Glucose, Bld: 105 mg/dL — ABNORMAL HIGH (ref 70–99)
Potassium: 3.9 mEq/L (ref 3.5–5.1)
Sodium: 140 mEq/L (ref 135–145)
Total Bilirubin: 0.5 mg/dL (ref 0.2–1.2)
Total Protein: 7.8 g/dL (ref 6.0–8.3)

## 2022-01-26 LAB — LIPID PANEL
Cholesterol: 122 mg/dL (ref 0–200)
HDL: 43.1 mg/dL (ref >39.00)
LDL Cholesterol: 60 mg/dL (ref 0–99)
NonHDL: 78.79
Total CHOL/HDL Ratio: 3
Triglycerides: 92 mg/dL (ref 0.0–149.0)
VLDL: 18.4 mg/dL (ref 0.0–40.0)

## 2022-01-26 MED ORDER — MOMETASONE FUROATE 0.1 % EX CREA: 1.0000 | TOPICAL_CREAM | Freq: Every day | CUTANEOUS | 1 refills | Status: DC

## 2022-01-26 MED ORDER — NEOMYCIN-POLYMYXIN-HC 1 % OT SOLN: OTIC | 0 refills | Status: DC

## 2022-01-26 MED ORDER — CETIRIZINE HCL 10 MG PO TABS
10.0000 mg | ORAL_TABLET | Freq: Every day | ORAL | 1 refills | Status: DC
Start: 2022-01-26 — End: 2024-03-21

## 2022-01-26 NOTE — Assessment & Plan Note (Signed)
Anticipate improved.  Continue Crestor '10mg'$ 

## 2022-01-26 NOTE — Patient Instructions (Addendum)
Start with the Corticosporin solution in your right ear to cover for bacterial infection.  After which if not resolved, I prescribed a low-dose steroid Elocon which can be used to treat eczema.  Please let me know if no resolution   this is  Dr. Lupita Dawn  example of a  "Low GI"  Diet:  It will allow you to lose 4 to 8  lbs  per month if you follow it carefully.  Your goal with exercise is a minimum of 30 minutes of aerobic exercise 5 days per week (Walking does not count once it becomes easy!)    All of the foods can be found at grocery stores and in bulk at [employer].  The Atkins protein bars and shakes are available in more varieties at [employer], [employer] and [place].     7 AM Breakfast:  Choose from the following:  Low carbohydrate Protein  Shakes (I recommend the  Premier Protein chocolate shakes,  EAS AdvantEdge "Carb Control" shakes  Or the Atkins shakes all are under 3 net carbs)     a scrambled egg/bacon/cheese burrito made with Mission's "carb balance" whole wheat tortilla  (about 10 net carbs )  Regulatory affairs officer (basically a quiche without the pastry crust) that is eaten cold and very convenient way to get your eggs.  8 carbs)  If you make your own protein shakes, avoid bananas and pineapple,  And use low carb greek yogurt or original /unsweetened almond or soy milk    Avoid cereal and bananas, oatmeal and cream of wheat and grits. They are loaded with carbohydrates!   10 AM: high protein snack:  Protein bar by Atkins (the snack size, under 200 cal, usually < 6 net carbs).    A stick of cheese:  Around 1 carb,  100 cal     Dannon Light n Fit [place] Yogurt  (80 cal, 8 carbs)  Other so called "protein bars" and Greek yogurts tend to be loaded with carbohydrates.  Remember, in food advertising, the word "energy" is synonymous for " carbohydrate."  Lunch:   A Sandwich using the bread choices listed, Can use any  Eggs,  lunchmeat, grilled meat or canned tuna),  avocado, regular mayo/mustard  and cheese.  A Salad using blue cheese, ranch,  Goddess or vinagrette,  Avoid taco shells, croutons or "confetti" and no "candied nuts" but regular nuts OK.   No pretzels, nabs  or chips.  Pickles and miniature sweet peppers are a good low carb alternative that provide a "crunch"  The bread is the only source of carbohydrate in a sandwich and  can be decreased by trying some of the attached alternatives to traditional loaf bread   Avoid "Low fat dressings, as well as Bethany dressings They are loaded with sugar!   3 PM/ Mid day  Snack:  Consider  1 ounce of  almonds, walnuts, pistachios, pecans, peanuts,  Macadamia nuts or a nut medley.  Avoid "granola and granola bars "  Mixed nuts are ok in moderation as long as there are no raisins,  cranberries or dried fruit.   KIND bars are OK if you get the low glycemic index variety   Try the prosciutto/mozzarella cheese sticks by [employer]  In deli /backery section   High protein      6 PM  Dinner:     Meat/fowl/fish with a green salad, and either broccoli, cauliflower, green beans, spinach, brussel sprouts or  Lima beans. DO NOT BREAD THE PROTEIN!!      There is a low carb pasta by Dreamfield's that is acceptable and tastes great: only 5 digestible carbs/serving.( All grocery stores but [employer]Js carry it ) Several ready made meals are available low carb:   Try Michel Angelo's chicken piccata or chicken or eggplant parm over low carb pasta.(Lowes and BJs)   Marjory Lies Sanchez's "Carnitas" (pulled pork, no sauce,  0 carbs) or his beef pot roast to make a dinner burrito (at [employer])  Pesto over low carb pasta (bj's sells a good quality pesto in the center refrigerated section of the deli   Try satueeing  Cheral Marker with mushroooms as a good side   [employer] makes a mashed cauliflower that tastes like mashed potatoes  Whole wheat pasta is still full of digestible carbs and  Not as low in glycemic index as  Dreamfield's.   Brown rice is still rice,  So skip the rice and noodles if you eat [place] or [place] (or at least limit to 1/2 cup)  9 PM snack :   Breyer's "low carb" fudgsicle or  ice cream bar (Carb Smart line), or  Weight Watcher's ice cream bar , or another "no sugar added" ice cream;  a serving of fresh berries/cherries with whipped cream   Cheese or DANNON'S LlGHT N FIT GREEK YOGURT  8 ounces of [place] unsweetened almond/cococunut milk    Treat yourself to a parfait made with whipped cream blueberiies, walnuts and vanilla greek yogurt  Avoid bananas, pineapple, grapes  and watermelon on a regular basis because they are high in sugar.  THINK OF THEM AS DESSERT  Remember that snack Substitutions should be less than 10 NET carbs per serving and meals < 20 carbs. Remember to subtract fiber grams to get the "net carbs."

## 2022-01-26 NOTE — Assessment & Plan Note (Signed)
Chronic, stable.  Continue atenolol 50 mg, hctz '25mg'$ 

## 2022-01-26 NOTE — Assessment & Plan Note (Signed)
Presentation consistent with otitis externa versus atopic dermatitis.  Advised her to start with Corticosporin first and if symptoms persist, then she may use Elocon.  Advised her to start Zyrtec.  She will let me know how she is doing

## 2022-01-26 NOTE — Progress Notes (Signed)
Subjective:    Patient ID: Shelley Massey, female    DOB: 11-09-1965, 57 y.o.   MRN: [id number]  CC: Shelley Massey is a 56 y.o. female who presents today for follow up.   HPI: Headache and nausea has resolved.   She is not regularly exercising. Endorses dietary indiscretion and she plans to work on diet with her partner.       Complains of right ear popping and itching. She will crust from ear.   She will neosporin. Nasal congestion.   No fever, cough, sinus pain, vision changes, vertigo.   No h/o atopic dermatitis She wears ear buds.  No swimming    HTN- compliant with atenolol 50 mg, hctz '25mg'$ . No cp, sob  Hyperlipidemia-compliant with Crestor 10 mg  HISTORY:  Past Medical History:  Diagnosis Date   Elevated LFTs    Fatty liver    Hay fever    Hypertension    Wears glasses    Past Surgical History:  Procedure Laterality Date   CARPAL TUNNEL RELEASE Bilateral    GANGLION CYST EXCISION Left 2015   KNEE ARTHROSCOPY W/ MENISCAL REPAIR Right 2001   [place], s/p repair x 2   MASS EXCISION Left 08/12/2013   Procedure: EXCISION VOLAR MASS LEFT WRIST AND THUMB;  Surgeon: Schuyler Amor, MD;  Location: [facility];  Service: Orthopedics;  Laterality: Left;   TONSILLECTOMY     TRIGGER FINGER RELEASE Left 02/24/2017   Procedure: MINOR RELEASE TRIGGER FINGER/A-1 PULLEY;  Surgeon: Charlotte Crumb, MD;  Location: [facility];  Service: Orthopedics;  Laterality: Left;   WISDOM TOOTH EXTRACTION     Family History  Adopted: Yes  Problem Relation Age of Onset   Hypertension Sister    Colon cancer Neg Hx        from birth brothers    Allergies: Patient has no known allergies. Current Outpatient Medications on File Prior to Visit  Medication Sig Dispense Refill   amLODipine (NORVASC) 10 MG tablet Take 1 tablet by mouth once daily 90 tablet 0   aspirin 81 MG tablet Take 81 mg by mouth daily.     atenolol (TENORMIN) 50 MG tablet TAKE 1  TABLET BY MOUTH ONCE DAILY. APPOINTMENT NEEDED FOR FURTHER REFILLS. 90 tablet 3   cholecalciferol (VITAMIN D) 1000 UNITS tablet Take 1,000 Units by mouth daily.     escitalopram (LEXAPRO) 10 MG tablet Take 1 tablet (10 mg total) by mouth daily. 90 tablet 3   Flaxseed, Linseed, (FLAXSEED OIL) 1000 MG CAPS Take 2 capsules by mouth daily.     hydrochlorothiazide (HYDRODIURIL) 25 MG tablet Take 1 tablet by mouth once daily 90 tablet 0   Omega-3 Fatty Acids (FISH OIL) 1200 MG CAPS Take 2 capsules by mouth daily.     rosuvastatin (CRESTOR) 10 MG tablet TAKE 1 TABLET BY MOUTH ONCE DAILY IN THE EVENING 90 tablet 0   Vitamin E 400 UNITS TABS Take 2 tablets by mouth once daily 60 tablet 2   No current facility-administered medications on file prior to visit.    Social History   Tobacco Use   Smoking status: Never   Smokeless tobacco: Never  Substance Use Topics   Alcohol use: Not Currently    Alcohol/week: 7.0 - 10.0 standard drinks of alcohol    Types: 6 - 8 Cans of beer, 1 - 2 Shots of liquor per week    Comment: quit 01/2018   Drug use: No  Review of Systems  Constitutional:  Negative for chills and fever.  HENT:  Positive for congestion and ear discharge. Negative for ear pain, sinus pressure, sinus pain and sore throat.   Respiratory:  Negative for cough.   Cardiovascular:  Negative for chest pain and palpitations.  Gastrointestinal:  Negative for nausea and vomiting.  Neurological:  Negative for headaches.      Objective:    BP 118/68 (BP Location: Left Arm, Patient Position: Sitting, Cuff Size: Normal)   Pulse 66   Temp 97.8 F (36.6 C) (Oral)   Ht '5\' 1"'$  (1.549 m)   Wt 168 lb 9.6 oz (76.5 kg)   LMP  (LMP Unknown)   SpO2 97%   BMI 31.86 kg/m  BP Readings from Last 3 Encounters:  01/26/22 118/68  03/15/21 118/72  01/11/21 134/64   Wt Readings from Last 3 Encounters:  01/26/22 168 lb 9.6 oz (76.5 kg)  03/15/21 166 lb 3.2 oz (75.4 kg)  01/11/21 165 lb 3.2 oz (74.9 kg)     Physical Exam Vitals reviewed.  Constitutional:      Appearance: She is well-developed.  HENT:     Head: Normocephalic and atraumatic.     Right Ear: Hearing, tympanic membrane, ear canal and external ear normal. No decreased hearing noted. Drainage present. No swelling or tenderness. No middle ear effusion. There is no impacted cerumen. No foreign body. Tympanic membrane is not erythematous or bulging.     Left Ear: Hearing, tympanic membrane, ear canal and external ear normal. No decreased hearing noted. No drainage, swelling or tenderness.  No middle ear effusion. No foreign body. Tympanic membrane is not erythematous or bulging.     Ears:     Comments: Yellow sebaceous appearing flaky material in right external ear canal.    Nose: Nose normal. No rhinorrhea.     Right Sinus: No maxillary sinus tenderness or frontal sinus tenderness.     Left Sinus: No maxillary sinus tenderness or frontal sinus tenderness.     Mouth/Throat:     Pharynx: Uvula midline. No oropharyngeal exudate or posterior oropharyngeal erythema.     Tonsils: No tonsillar abscesses.  Eyes:     Conjunctiva/sclera: Conjunctivae normal.  Cardiovascular:     Rate and Rhythm: Regular rhythm.     Pulses: Normal pulses.     Heart sounds: Normal heart sounds.  Pulmonary:     Effort: Pulmonary effort is normal.     Breath sounds: Normal breath sounds. No wheezing, rhonchi or rales.  Lymphadenopathy:     Head:     Right side of head: No submental, submandibular, tonsillar, preauricular, posterior auricular or occipital adenopathy.     Left side of head: No submental, submandibular, tonsillar, preauricular, posterior auricular or occipital adenopathy.     Cervical: No cervical adenopathy.  Skin:    General: Skin is warm and dry.  Neurological:     Mental Status: She is alert.  Psychiatric:        Speech: Speech normal.        Behavior: Behavior normal.        Thought Content: Thought content normal.         Assessment & Plan:   Problem List Items Addressed This Visit       Cardiovascular and Mediastinum   Hypertension    Chronic, stable.  Continue atenolol 50 mg, hctz '25mg'$       Relevant Orders   Comprehensive metabolic panel     Nervous and Auditory  Otitis externa - Primary    Presentation consistent with otitis externa versus atopic dermatitis.  Advised her to start with Corticosporin first and if symptoms persist, then she may use Elocon.  Advised her to start Zyrtec.  She will let me know how she is doing      Relevant Medications   mometasone (ELOCON) 0.1 % cream   NEOMYCIN-POLYMYXIN-HYDROCORTISONE (CORTISPORIN) 1 % SOLN OTIC solution   cetirizine (ZYRTEC ALLERGY) 10 MG tablet     Other   HLD (hyperlipidemia)    Anticipate improved.  Continue Crestor '10mg'$       Relevant Orders   Lipid panel     I am having Mardene Celeste L. Tamburri start on mometasone, NEOMYCIN-POLYMYXIN-HYDROCORTISONE, and cetirizine. I am also having her maintain her Flaxseed Oil, Fish Oil, aspirin, Vitamin E, cholecalciferol, atenolol, escitalopram, amLODipine, hydrochlorothiazide, and rosuvastatin.   Meds ordered this encounter  Medications   mometasone (ELOCON) 0.1 % cream    Sig: Apply 1 Application topically daily. Appear pea sized ( or less) amount to external ear, and slightly in canal.    Dispense:  15 g    Refill:  1    Order Specific Question:   Supervising Provider    Answer:   Deborra Medina L [[id number]]   NEOMYCIN-POLYMYXIN-HYDROCORTISONE (CORTISPORIN) 1 % SOLN OTIC solution    Sig: 4 gtt in affected ear(s) tid, max of 10 days. Lie with affected ear upward x 5 minutes    Dispense:  10 mL    Refill:  0    Order Specific Question:   Supervising Provider    Answer:   Deborra Medina L [[id number]]   cetirizine (ZYRTEC ALLERGY) 10 MG tablet    Sig: Take 1 tablet (10 mg total) by mouth daily. While congestion persists    Dispense:  90 tablet    Refill:  1    Order Specific Question:   Supervising  Provider    Answer:   Crecencio Mc [[id number]]    Return precautions given.   Risks, benefits, and alternatives of the medications and treatment plan prescribed today were discussed, and patient expressed understanding.   Education regarding symptom management and diagnosis given to patient on AVS.  Continue to follow with Burnard Hawthorne, FNP for routine health maintenance.   Ronita Hipps and I agreed with plan.   Mable Paris, FNP

## 2022-02-03 ENCOUNTER — Other Ambulatory Visit: Payer: Self-pay | Admitting: Family

## 2022-02-03 DIAGNOSIS — I10 Essential (primary) hypertension: Secondary | ICD-10-CM

## 2022-03-08 ENCOUNTER — Encounter: Payer: Self-pay | Admitting: Family

## 2022-03-09 ENCOUNTER — Encounter: Payer: Self-pay | Admitting: Family Medicine

## 2022-03-09 ENCOUNTER — Ambulatory Visit: Payer: Federal, State, Local not specified - PPO | Admitting: Family Medicine

## 2022-03-09 VITALS — BP 126/80 | HR 67 | Temp 98.0°F | Ht 61.0 in | Wt 173.0 lb

## 2022-03-09 DIAGNOSIS — M7918 Myalgia, other site: Secondary | ICD-10-CM

## 2022-03-09 DIAGNOSIS — M25512 Pain in left shoulder: Secondary | ICD-10-CM | POA: Diagnosis not present

## 2022-03-09 MED ORDER — CYCLOBENZAPRINE HCL 5 MG PO TABS: 5.0000 mg | ORAL_TABLET | Freq: Every day | ORAL | 0 refills | Status: AC

## 2022-03-09 MED ORDER — PANTOPRAZOLE SODIUM 40 MG PO TBEC: 40.0000 mg | DELAYED_RELEASE_TABLET | Freq: Every day | ORAL | 0 refills | Status: DC

## 2022-03-09 MED ORDER — MELOXICAM 15 MG PO TABS: 15.0000 mg | ORAL_TABLET | Freq: Every day | ORAL | 0 refills | Status: DC

## 2022-03-09 NOTE — Patient Instructions (Signed)
It was a pleasure meeting you today. Thank you for allowing me to take part in your health care.  Our goals for today as we discussed include:  For your shoulder pain Start Meloxicam 15 mg daily Start Protonix 40 mg daily Can use Diclofenac gel 4 times a day  Can use Biofreeze 3-4 times a day  Heat as needed Massage therapy once pain subsides in a couple of days Flexeril 5 mg at night.  Can increase to 2 times a day if tolerated   Please follow-up with PCP in 2 weeks  If you have any questions or concerns, please do not hesitate to call the office at [phone].  I look forward to our next visit and until then take care and stay safe.  Regards,   Carollee Leitz, MD   [facility]   Shoulder Exercises Ask your health care provider which exercises are safe for you. Do exercises exactly as told by your health care provider and adjust them as directed. It is normal to feel mild stretching, pulling, tightness, or discomfort as you do these exercises. Stop right away if you feel sudden pain or your pain gets worse. Do not begin these exercises until told by your health care provider. Stretching exercises External rotation and abduction This exercise is sometimes called corner stretch. The exercise rotates your arm outward (external rotation) and moves your arm out from your body (abduction). Stand in a doorway with one of your feet slightly in front of the other. This is called a staggered stance. If you cannot reach your forearms to the door frame, stand facing a corner of a room. Choose one of the following positions as told by your health care provider: Place your hands and forearms on the door frame above your head. Place your hands and forearms on the door frame at the height of your head. Place your hands on the door frame at the height of your elbows. Slowly move your weight onto your front foot until you feel a stretch across your chest and in the front of your  shoulders. Keep your head and chest upright and keep your abdominal muscles tight. Hold for __________ seconds. To release the stretch, shift your weight to your back foot. Repeat __________ times. Complete this exercise __________ times a day. Extension, standing  Stand and hold a broomstick, a cane, or a similar object behind your back. Your hands should be a little wider than shoulder-width apart. Your palms should face away from your back. Keeping your elbows straight and your shoulder muscles relaxed, move the stick away from your body until you feel a stretch in your shoulders (extension). Avoid shrugging your shoulders while you move the stick. Keep your shoulder blades tucked down toward the middle of your back. Hold for __________ seconds. Slowly return to the starting position. Repeat __________ times. Complete this exercise __________ times a day. Range-of-motion exercises Pendulum  Stand near a wall or a surface that you can hold onto for balance. Bend at the waist and let your left / right arm hang straight down. Use your other arm to support you. Keep your back straight and do not lock your knees. Relax your left / right arm and shoulder muscles, and move your hips and your trunk so your left / right arm swings freely. Your arm should swing because of the motion of your body, not because you are using your arm or shoulder muscles. Keep moving your hips and trunk so your  arm swings in the following directions, as told by your health care provider: Side to side. Forward and backward. In clockwise and counterclockwise circles. Continue each motion for __________ seconds, or for as long as told by your health care provider. Slowly return to the starting position. Repeat __________ times. Complete this exercise __________ times a day. Shoulder flexion, standing  Stand and hold a broomstick, a cane, or a similar object. Place your hands a little more than shoulder-width apart on  the object. Your left / right hand should be palm-up, and your other hand should be palm-down. Keep your elbow straight and your shoulder muscles relaxed. Push the stick up with your healthy arm to raise your left / right arm in front of your body, and then over your head until you feel a stretch in your shoulder (flexion). Avoid shrugging your shoulder while you raise your arm. Keep your shoulder blade tucked down toward the middle of your back. Hold for __________ seconds. Slowly return to the starting position. Repeat __________ times. Complete this exercise __________ times a day. Shoulder abduction, standing  Stand and hold a broomstick, a cane, or a similar object. Place your hands a little more than shoulder-width apart on the object. Your left / right hand should be palm-up, and your other hand should be palm-down. Keep your elbow straight and your shoulder muscles relaxed. Push the object across your body toward your left / right side. Raise your left / right arm to the side of your body (abduction) until you feel a stretch in your shoulder. Do not raise your arm above shoulder height unless your health care provider tells you to do that. If directed, raise your arm over your head. Avoid shrugging your shoulder while you raise your arm. Keep your shoulder blade tucked down toward the middle of your back. Hold for __________ seconds. Slowly return to the starting position. Repeat __________ times. Complete this exercise __________ times a day. Internal rotation  Place your left / right hand behind your back, palm-up. Use your other hand to dangle an exercise band, a broomstick, or a similar object over your shoulder. Grasp the band with your left / right hand so you are holding on to both ends. Gently pull up on the band until you feel a stretch in the front of your left / right shoulder. The movement of your arm toward the center of your body is called internal rotation. Avoid shrugging  your shoulder while you raise your arm. Keep your shoulder blade tucked down toward the middle of your back. Hold for __________ seconds. Release the stretch by letting go of the band and lowering your hands. Repeat __________ times. Complete this exercise __________ times a day. Strengthening exercises External rotation  Sit in a stable chair without armrests. Secure an exercise band to a stable object at elbow height on your left / right side. Place a soft object, such as a folded towel or a small pillow, between your left / right upper arm and your body to move your elbow about 4 inches (10 cm) away from your side. Hold the end of the exercise band so it is tight and there is no slack. Keeping your elbow pressed against the soft object, slowly move your forearm out, away from your abdomen (external rotation). Keep your body steady so only your forearm moves. Hold for __________ seconds. Slowly return to the starting position. Repeat __________ times. Complete this exercise __________ times a day. Shoulder abduction  Sit  in a stable chair without armrests, or stand up. Hold a __________ lb / kg weight in your left / right hand, or hold an exercise band with both hands. Start with your arms straight down and your left / right palm facing in, toward your body. Slowly lift your left / right hand out to your side (abduction). Do not lift your hand above shoulder height unless your health care provider tells you that this is safe. Keep your arms straight. Avoid shrugging your shoulder while you do this movement. Keep your shoulder blade tucked down toward the middle of your back. Hold for __________ seconds. Slowly lower your arm, and return to the starting position. Repeat __________ times. Complete this exercise __________ times a day. Shoulder extension  Sit in a stable chair without armrests, or stand up. Secure an exercise band to a stable object in front of you so it is at shoulder  height. Hold one end of the exercise band in each hand. Straighten your elbows and lift your hands up to shoulder height. Squeeze your shoulder blades together as you pull your hands down to the sides of your thighs (extension). Stop when your hands are straight down by your sides. Do not let your hands go behind your body. Hold for __________ seconds. Slowly return to the starting position. Repeat __________ times. Complete this exercise __________ times a day. Shoulder row  Sit in a stable chair without armrests, or stand up. Secure an exercise band to a stable object in front of you so it is at chest height. Hold one end of the exercise band in each hand. Position your palms so that your thumbs are facing the ceiling (neutral position). Bend each of your elbows to a 90-degree angle (right angle) and keep your upper arms at your sides. Step back or move the chair back until the band is tight and there is no slack. Slowly pull your elbows back behind you. Hold for __________ seconds. Slowly return to the starting position. Repeat __________ times. Complete this exercise __________ times a day. Shoulder press-ups  Sit in a stable chair that has armrests. Sit upright, with your feet flat on the floor. Put your hands on the armrests so your elbows are bent and your fingers are pointing forward. Your hands should be about even with the sides of your body. Push down on the armrests and use your arms to lift yourself off the chair. Straighten your elbows and lift yourself up as much as you comfortably can. Move your shoulder blades down, and avoid letting your shoulders move up toward your ears. Keep your feet on the ground. As you get stronger, your feet should support less of your body weight as you lift yourself up. Hold for __________ seconds. Slowly lower yourself back into the chair. Repeat __________ times. Complete this exercise __________ times a day. Wall push-ups  Stand so you are  facing a stable wall. Your feet should be about one arm-length away from the wall. Lean forward and place your palms on the wall at shoulder height. Keep your feet flat on the floor as you bend your elbows and lean forward toward the wall. Hold for __________ seconds. Straighten your elbows to push yourself back to the starting position. Repeat __________ times. Complete this exercise __________ times a day. This information is not intended to replace advice given to you by your health care provider. Make sure you discuss any questions you have with your health care provider. Document Revised:  08/03/2021 Document Reviewed: 08/03/2021 Elsevier Patient Education  [place].

## 2022-03-09 NOTE — Progress Notes (Signed)
    SUBJECTIVE:   CHIEF COMPLAINT / HPI: pulled muscle in back  Left side upper back pain.  Patient reports having pulled a muscle in back  while sleeping about 2 weeks ago.  Woke up the following morning with a burning sensation down her shoulder blade.  Also endorses the upper portion of her left arm felt numb sensation.  Has tried multiple OTC medications without relief.  Denies any fevers, injury, repetitive motion, neck pain or injury.  Endorses overreaching and is left hand predominant.  Works mostly at [employer].  Has been working on improving posture.  PERTINENT  PMH / PSH:  None  OBJECTIVE:   BP 126/80 (BP Location: Left Arm, Patient Position: Sitting, Cuff Size: Normal)   Pulse 67   Temp 98 F (36.7 C) (Oral)   Ht '5\' 1"'$  (1.549 m)   Wt 173 lb (78.5 kg)   SpO2 98%   BMI 32.69 kg/m    General: Alert, no acute distress Neck exam: No gross deformity, swelling, bruising No midline/bony TTP. FROM. BUE strength 5/5.   Sensation intact to light touch.   2+ equal reflexes in triceps, biceps, brachioradialis tendons. Negative spurlings. NV intact distal BUEs.   Back - Normal skin, Spine with normal alignment and no deformity.  No tenderness to vertebral process palpation. Paraspinous muscles are tender and with spasm most notable along the scapular area.  Range of motion is full at neck and lumbar sacral regions    ASSESSMENT/PLAN:   Rhomboid muscle pain Suspect MSK pain from overreaching and poor posture.  Likely slept in position not allowing for relaxation which increased tension. Suspect increase in tension irritating surrounding nerves. -Will trial Mobic 15 mg daily  -Start Protonix 40 mg daily while on NSAID -Flexeril 5 mg at night, can increase to BID if tolerated in 2 days. -Diclofenac gel qid prn -Biofreeze qid prn -Heat/Ice as needed -Gentle shoulder exercises once pain subsided  -Massage therapy -Consider PT at next visit -Follow up in 2 weeks or sooner if  needed    PDMP Reviewed  Carollee Leitz, MD

## 2022-03-13 ENCOUNTER — Encounter: Payer: Self-pay | Admitting: Family Medicine

## 2022-03-13 DIAGNOSIS — M7918 Myalgia, other site: Secondary | ICD-10-CM | POA: Insufficient documentation

## 2022-03-13 NOTE — Assessment & Plan Note (Signed)
Suspect MSK pain from overreaching and poor posture.  Likely slept in position not allowing for relaxation which increased tension. Suspect increase in tension irritating surrounding nerves. -Will trial Mobic 15 mg daily  -Start Protonix 40 mg daily while on NSAID -Flexeril 5 mg at night, can increase to BID if tolerated in 2 days. -Diclofenac gel qid prn -Biofreeze qid prn -Heat/Ice as needed -Gentle shoulder exercises once pain subsided  -Massage therapy -Consider PT at next visit -Follow up in 2 weeks or sooner if needed

## 2022-03-22 ENCOUNTER — Encounter: Payer: Self-pay | Admitting: Family

## 2022-03-23 ENCOUNTER — Other Ambulatory Visit: Payer: Self-pay | Admitting: Family

## 2022-03-23 DIAGNOSIS — M7918 Myalgia, other site: Secondary | ICD-10-CM

## 2022-04-10 ENCOUNTER — Other Ambulatory Visit: Payer: Self-pay | Admitting: Family

## 2022-04-13 DIAGNOSIS — M5030 Other cervical disc degeneration, unspecified cervical region: Secondary | ICD-10-CM | POA: Diagnosis not present

## 2022-04-13 DIAGNOSIS — M47812 Spondylosis without myelopathy or radiculopathy, cervical region: Secondary | ICD-10-CM | POA: Diagnosis not present

## 2022-04-13 DIAGNOSIS — M79622 Pain in left upper arm: Secondary | ICD-10-CM | POA: Diagnosis not present

## 2022-04-13 DIAGNOSIS — M4312 Spondylolisthesis, cervical region: Secondary | ICD-10-CM | POA: Diagnosis not present

## 2022-04-13 DIAGNOSIS — M5412 Radiculopathy, cervical region: Secondary | ICD-10-CM | POA: Diagnosis not present

## 2022-04-17 ENCOUNTER — Other Ambulatory Visit: Payer: Self-pay | Admitting: Family

## 2022-04-17 DIAGNOSIS — I10 Essential (primary) hypertension: Secondary | ICD-10-CM

## 2022-04-21 ENCOUNTER — Other Ambulatory Visit: Payer: Self-pay | Admitting: Family

## 2022-04-21 DIAGNOSIS — I10 Essential (primary) hypertension: Secondary | ICD-10-CM

## 2022-04-21 NOTE — Telephone Encounter (Signed)
REFILL SENT PT AWARE

## 2022-04-25 ENCOUNTER — Other Ambulatory Visit: Payer: Self-pay

## 2022-04-25 ENCOUNTER — Other Ambulatory Visit: Payer: Self-pay | Admitting: Family

## 2022-04-25 DIAGNOSIS — I10 Essential (primary) hypertension: Secondary | ICD-10-CM

## 2022-04-25 MED ORDER — AMLODIPINE BESYLATE 10 MG PO TABS: 10.0000 mg | ORAL_TABLET | Freq: Every day | ORAL | 0 refills | Status: DC

## 2022-04-25 NOTE — Telephone Encounter (Signed)
Pt need a refill on amLODipine sent [facility]

## 2022-04-25 NOTE — Telephone Encounter (Signed)
Refill sent in on today.

## 2022-04-25 NOTE — Telephone Encounter (Signed)
Patient called about the statues of [employer].

## 2022-04-25 NOTE — Telephone Encounter (Signed)
Patient is calling about having her amLODipine (NORVASC) 10 MG tablet. Patient states it still has not been refilled from last week.

## 2022-05-10 DIAGNOSIS — M4802 Spinal stenosis, cervical region: Secondary | ICD-10-CM | POA: Diagnosis not present

## 2022-05-10 DIAGNOSIS — M5412 Radiculopathy, cervical region: Secondary | ICD-10-CM | POA: Diagnosis not present

## 2022-05-10 DIAGNOSIS — G959 Disease of spinal cord, unspecified: Secondary | ICD-10-CM | POA: Diagnosis not present

## 2022-05-10 DIAGNOSIS — M5011 Cervical disc disorder with radiculopathy,  high cervical region: Secondary | ICD-10-CM | POA: Diagnosis not present

## 2022-05-10 DIAGNOSIS — M4722 Other spondylosis with radiculopathy, cervical region: Secondary | ICD-10-CM | POA: Diagnosis not present

## 2022-05-16 ENCOUNTER — Other Ambulatory Visit: Payer: Self-pay | Admitting: Family

## 2022-05-17 MED ORDER — ROSUVASTATIN CALCIUM 10 MG PO TABS: 10.0000 mg | ORAL_TABLET | Freq: Every evening | ORAL | 0 refills | Status: DC

## 2022-07-14 ENCOUNTER — Other Ambulatory Visit: Payer: Self-pay | Admitting: Family

## 2022-07-14 DIAGNOSIS — I10 Essential (primary) hypertension: Secondary | ICD-10-CM

## 2022-07-17 ENCOUNTER — Other Ambulatory Visit: Payer: Self-pay | Admitting: Family

## 2022-07-17 DIAGNOSIS — I10 Essential (primary) hypertension: Secondary | ICD-10-CM

## 2022-07-18 MED ORDER — AMLODIPINE BESYLATE 10 MG PO TABS: 10.0000 mg | ORAL_TABLET | Freq: Every day | ORAL | 0 refills | Status: DC

## 2022-07-27 ENCOUNTER — Ambulatory Visit: Payer: Federal, State, Local not specified - PPO | Admitting: Family

## 2022-07-27 ENCOUNTER — Encounter: Payer: Self-pay | Admitting: Family

## 2022-07-27 VITALS — BP 120/78 | HR 90 | Temp 97.9°F | Ht 61.0 in | Wt 178.2 lb

## 2022-07-27 DIAGNOSIS — E669 Obesity, unspecified: Secondary | ICD-10-CM

## 2022-07-27 DIAGNOSIS — I10 Essential (primary) hypertension: Secondary | ICD-10-CM | POA: Diagnosis not present

## 2022-07-27 LAB — COMPREHENSIVE METABOLIC PANEL
ALT: 24 U/L (ref 0–35)
AST: 17 U/L (ref 0–37)
Albumin: 4.5 g/dL (ref 3.5–5.2)
Alkaline Phosphatase: 61 U/L (ref 39–117)
BUN: 13 mg/dL (ref 6–23)
CO2: 30 mEq/L (ref 19–32)
Calcium: 9.4 mg/dL (ref 8.4–10.5)
Chloride: 103 mEq/L (ref 96–112)
Creatinine, Ser: 0.65 mg/dL (ref 0.40–1.20)
GFR: 100.02 mL/min (ref >60.00)
Glucose, Bld: 105 mg/dL — ABNORMAL HIGH (ref 70–99)
Potassium: 3.9 mEq/L (ref 3.5–5.1)
Sodium: 141 mEq/L (ref 135–145)
Total Bilirubin: 0.4 mg/dL (ref 0.2–1.2)
Total Protein: 7.3 g/dL (ref 6.0–8.3)

## 2022-07-27 MED ORDER — WEGOVY 0.25 MG/0.5ML ~~LOC~~ SOAJ: 0.2500 mg | SUBCUTANEOUS | 2 refills | Status: DC

## 2022-07-27 NOTE — Progress Notes (Signed)
Assessment & Plan:  Obesity (BMI 30-39.9) Assessment & Plan: Patient started using stationary bike and eating healthier.  Congratulated her on lifestyle changes.  We will start [facility] and titrate.  Counseled patient on blackbox warning as a relates to medullary thyroid cancer, multiple endocrine neoplasia.  Counseled on side effects, administration.  Orders: -     Wegovy; Inject 0.25 mg into the skin once a week.  Dispense: 2 mL; Refill: 2  Primary hypertension Assessment & Plan: Chronic, stable.  Continue atenolol 50 mg qd,  hctz '25mg'$  qd, amlodipine '10mg'$  qd  Orders: -     Comprehensive metabolic panel     Return precautions given.   Risks, benefits, and alternatives of the medications and treatment plan prescribed today were discussed, and patient expressed understanding.   Education regarding symptom management and diagnosis given to patient on AVS either electronically or printed.  Return in about 3 months (around 10/25/2022).  Mable Paris, FNP  Subjective:    Patient ID: Ronita Hipps, female    DOB: 1965-09-27, 57 y.o.   MRN: [id number]  CC: GIANNINA BARTOLOME is a 57 y.o. female who presents today for follow up.   HPI: She is frustrated by weight gain.  She is sitting at desk at work. She is using exercise under her desk.  She is eating healthier, portion control as well as protein shakes for breakfast and lunch.   Sober since 2020.     No personal or family history of thyroid cancer, MEN   HTN-compliant with atenolol 50 mg daily, hydrochlorothiazide 25 mg daily, amlodipine '10mg'$  qd  Allergies: Patient has no known allergies. Current Outpatient Medications on File Prior to Visit  Medication Sig Dispense Refill   amLODipine (NORVASC) 10 MG tablet Take 1 tablet (10 mg total) by mouth daily. 90 tablet 0   aspirin 81 MG tablet Take 81 mg by mouth daily.     atenolol (TENORMIN) 50 MG tablet TAKE 1 TABLET BY MOUTH ONCE DAILY. APPOINTMENT NEEDED FOR FURTHER  REFILLS. 90 tablet 0   cetirizine (ZYRTEC ALLERGY) 10 MG tablet Take 1 tablet (10 mg total) by mouth daily. While congestion persists 90 tablet 1   cholecalciferol (VITAMIN D) 1000 UNITS tablet Take 1,000 Units by mouth daily.     cyclobenzaprine (FLEXERIL) 5 MG tablet Take 1 tablet (5 mg total) by mouth at bedtime. 30 tablet 0   escitalopram (LEXAPRO) 10 MG tablet Take 1 tablet (10 mg total) by mouth daily. 90 tablet 3   hydrochlorothiazide (HYDRODIURIL) 25 MG tablet Take 1 tablet by mouth once daily 90 tablet 0   meloxicam (MOBIC) 15 MG tablet Take 1 tablet (15 mg total) by mouth daily. 30 tablet 0   mometasone (ELOCON) 0.1 % cream Apply 1 Application topically daily. Appear pea sized ( or less) amount to external ear, and slightly in canal. 15 g 1   NEOMYCIN-POLYMYXIN-HYDROCORTISONE (CORTISPORIN) 1 % SOLN OTIC solution 4 gtt in affected ear(s) tid, max of 10 days. Lie with affected ear upward x 5 minutes 10 mL 0   Omega-3 Fatty Acids (FISH OIL) 1200 MG CAPS Take 2 capsules by mouth daily.     pantoprazole (PROTONIX) 40 MG tablet Take 1 tablet (40 mg total) by mouth daily. 30 tablet 0   rosuvastatin (CRESTOR) 10 MG tablet Take 1 tablet (10 mg total) by mouth every evening. 90 tablet 0   Vitamin E 400 UNITS TABS Take 2 tablets by mouth once daily 60 tablet 2  No current facility-administered medications on file prior to visit.    Review of Systems  Constitutional:  Negative for chills and fever.  Respiratory:  Negative for cough.   Cardiovascular:  Negative for chest pain and palpitations.  Gastrointestinal:  Negative for nausea and vomiting.      Objective:    BP 120/78   Pulse 90   Temp 97.9 F (36.6 C) (Oral)   Ht '5\' 1"'$  (1.549 m)   Wt 178 lb 3.2 oz (80.8 kg)   SpO2 96%   BMI 33.67 kg/m  BP Readings from Last 3 Encounters:  07/27/22 120/78  03/09/22 126/80  01/26/22 118/68   Wt Readings from Last 3 Encounters:  07/27/22 178 lb 3.2 oz (80.8 kg)  03/09/22 173 lb (78.5 kg)   01/26/22 168 lb 9.6 oz (76.5 kg)    Physical Exam Vitals reviewed.  Constitutional:      Appearance: She is well-developed.  Eyes:     Conjunctiva/sclera: Conjunctivae normal.  Neck:     Thyroid: No thyroid mass, thyromegaly or thyroid tenderness.  Cardiovascular:     Rate and Rhythm: Normal rate and regular rhythm.     Pulses: Normal pulses.     Heart sounds: Normal heart sounds.  Pulmonary:     Effort: Pulmonary effort is normal.     Breath sounds: Normal breath sounds. No wheezing, rhonchi or rales.  Skin:    General: Skin is warm and dry.  Neurological:     Mental Status: She is alert.  Psychiatric:        Speech: Speech normal.        Behavior: Behavior normal.        Thought Content: Thought content normal.

## 2022-07-27 NOTE — Assessment & Plan Note (Signed)
Patient started using stationary bike and eating healthier.  Congratulated her on lifestyle changes.  We will start [facility] and titrate.  Counseled patient on blackbox warning as a relates to medullary thyroid cancer, multiple endocrine neoplasia.  Counseled on side effects, administration.

## 2022-07-27 NOTE — Patient Instructions (Addendum)
Of note, full code is not approved by insurance, we will trial metformin for weight loss   we have discussed starting non insulin daily injectable medication called Wegovy  which is a glucagon like peptide (GLP 1) agonist and works by delaying gastric emptying and increasing insulin secretion.It is given once per week. Most patients see significant weight loss with this drug class.   You may NOT take either medication if you or your family has history of thyroid, parathyroid, OR adrenal cancer. Please confirm you and your family does NOT have this history as this drug class has black box warning on this medication for that reason.   Please follow  directions on prescription and slowly increase from 0.'25mg'$  Froid once per week ;stay here for 4 weeks. You may then increase to 0.'5mg'$  [place] once per week and stay there for 4 weeks.  We can slowly titrate further at follow up with goal of no more than 1-2 lbs weight loss per week.  Semaglutide [facility])  Dose (mg) Once Weekly Titration:    If a dose is not tolerated, consider delaying further dose increases for  another 4 weeks.  If you are actively losing weight on a dose, do not increase medication.   Weeks 1 through 4  0.25 mg once weekly  Weeks 5 through 8  0.5 mg once weekly  Weeks 9 though 12  1 mg once weekly  Weeks 13 through 16  1.7 mg once weekly   Semaglutide Injection (Weight Management) What is this medication? SEMAGLUTIDE (SEM a GLOO tide) promotes weight loss. It may also be used to maintain weight loss. It works by decreasing appetite. Changes to diet and exercise are often combined with this medication. This medicine may be used for other purposes; ask your health care provider or pharmacist if you have questions. COMMON BRAND NAME(S): [id number] What should I tell my care team before I take this medication? They need to know if you have any of these conditions: Endocrine tumors (MEN 2) or if someone in your family had these  tumors Eye disease, vision problems Gallbladder disease History of depression or mental health disease History of pancreatitis Kidney disease Stomach or intestine problems Suicidal thoughts, plans, or attempt; a previous suicide attempt by you or a family member Thyroid cancer or if someone in your family had thyroid cancer An unusual or allergic reaction to semaglutide, other medications, foods, dyes, or preservatives Pregnant or trying to get pregnant Breast-feeding How should I use this medication? This medication is injected under the skin. You will be taught how to prepare and give it. Take it as directed on the prescription label. It is given once every week (every 7 days). Keep taking it unless your care team tells you to stop. It is important that you put your used needles and pens in a special sharps container. Do not put them in a trash can. If you do not have a sharps container, call your pharmacist or care team to get one. A special MedGuide will be given to you by the pharmacist with each prescription and refill. Be sure to read this information carefully each time. This medication comes with INSTRUCTIONS FOR USE. Ask your pharmacist for directions on how to use this medication. Read the information carefully. Talk to your pharmacist or care team if you have questions. Talk to your care team about the use of this medication in children. While it may be prescribed for children as young as 12 years for selected  conditions, precautions do apply. Overdosage: If you think you have taken too much of this medicine contact a poison control center or emergency room at once. NOTE: This medicine is only for you. Do not share this medicine with others. What if I miss a dose? If you miss a dose and the next scheduled dose is more than 2 days away, take the missed dose as soon as possible. If you miss a dose and the next scheduled dose is less than 2 days away, do not take the missed dose. Take  the next dose at your regular time. Do not take double or extra doses. If you miss your dose for 2 weeks or more, take the next dose at your regular time or call your care team to talk about how to restart this medication. What may interact with this medication? Insulin and other medications for diabetes This list may not describe all possible interactions. Give your health care provider a list of all the medicines, herbs, non-prescription drugs, or dietary supplements you use. Also tell them if you smoke, drink alcohol, or use illegal drugs. Some items may interact with your medicine. What should I watch for while using this medication? Visit your care team for regular checks on your progress. It may be some time before you see the benefit from this medication. Drink plenty of fluids while taking this medication. Check with your care team if you have severe diarrhea, nausea, and vomiting, or if you sweat a lot. The loss of too much body fluid may make it dangerous for you to take this medication. This medication may affect blood sugar levels. Ask your care team if changes in diet or medications are needed if you have diabetes. If you or your family notice any changes in your behavior, such as new or worsening depression, thoughts of harming yourself, anxiety, other unusual or disturbing thoughts, or memory loss, call your care team right away. Women should inform their care team if they wish to become pregnant or think they might be pregnant. Losing weight while pregnant is not advised and may cause harm to the unborn child. Talk to your care team for more information. What side effects may I notice from receiving this medication? Side effects that you should report to your care team as soon as possible: Allergic reactions--skin rash, itching, hives, swelling of the face, lips, tongue, or throat Change in vision Dehydration--increased thirst, dry mouth, feeling faint or lightheaded, headache, dark  yellow or brown urine Gallbladder problems--severe stomach pain, nausea, vomiting, fever Heart palpitations--rapid, pounding, or irregular heartbeat Kidney injury--decrease in the amount of urine, swelling of the ankles, hands, or feet Pancreatitis--severe stomach pain that spreads to your back or gets worse after eating or when touched, fever, nausea, vomiting Thoughts of suicide or self-harm, worsening mood, feelings of depression Thyroid cancer--new mass or lump in the neck, pain or trouble swallowing, trouble breathing, hoarseness Side effects that usually do not require medical attention (report to your care team if they continue or are bothersome): Diarrhea Loss of appetite Nausea Stomach pain Vomiting This list may not describe all possible side effects. Call your doctor for medical advice about side effects. You may report side effects to FDA at 1-800-FDA-1088. Where should I keep my medication? Keep out of the reach of children and pets. Refrigeration (preferred): Store in the refrigerator. Do not freeze. Keep this medication in the original container until you are ready to take it. Get rid of any unused medication after the  expiration date. Room temperature: If needed, prior to cap removal, the pen can be stored at room temperature for up to 28 days. Protect from light. If it is stored at room temperature, get rid of any unused medication after 28 days or after it expires, whichever is first. It is important to get rid of the medication as soon as you no longer need it or it is expired. You can do this in two ways: Take the medication to a medication take-back program. Check with your pharmacy or law enforcement to find a location. If you cannot return the medication, follow the directions in the [place]. NOTE: This sheet is a summary. It may not cover all possible information. If you have questions about this medicine, talk to your doctor, pharmacist, or health care provider.  2023  Elsevier/Gold Standard (2020-08-27 00:00:00)

## 2022-07-27 NOTE — Assessment & Plan Note (Signed)
Chronic, stable.  Continue atenolol 50 mg qd,  hctz '25mg'$  qd, amlodipine '10mg'$  qd

## 2022-07-31 ENCOUNTER — Other Ambulatory Visit: Payer: Self-pay | Admitting: Family

## 2022-07-31 DIAGNOSIS — I10 Essential (primary) hypertension: Secondary | ICD-10-CM

## 2022-08-02 ENCOUNTER — Encounter: Payer: Self-pay | Admitting: Family

## 2022-08-04 ENCOUNTER — Other Ambulatory Visit: Payer: Self-pay | Admitting: Family

## 2022-08-04 DIAGNOSIS — E669 Obesity, unspecified: Secondary | ICD-10-CM

## 2022-08-04 MED ORDER — METFORMIN HCL ER 500 MG PO TB24: 500.0000 mg | ORAL_TABLET | Freq: Every evening | ORAL | 2 refills | Status: DC

## 2022-09-08 ENCOUNTER — Encounter: Payer: Self-pay | Admitting: Family

## 2022-09-13 ENCOUNTER — Other Ambulatory Visit: Payer: Self-pay

## 2022-09-13 DIAGNOSIS — E669 Obesity, unspecified: Secondary | ICD-10-CM

## 2022-09-13 MED ORDER — METFORMIN HCL ER 500 MG PO TB24: 500.0000 mg | ORAL_TABLET | Freq: Every evening | ORAL | 2 refills | Status: DC

## 2022-10-03 IMAGING — MG MM DIGITAL SCREENING BILAT W/ TOMO AND CAD
8 series · 8 of 24 positions shown · non-contrast
Comparison: None.

CLINICAL DATA: Screening.

EXAM:
DIGITAL SCREENING BILATERAL MAMMOGRAM WITH TOMOSYNTHESIS AND CAD
TECHNIQUE: Bilateral screening digital craniocaudal and mediolateral oblique
mammograms were obtained. Bilateral screening digital breast
tomosynthesis was performed. The images were evaluated with
computer-aided detection.

[R MLO synth-2D]
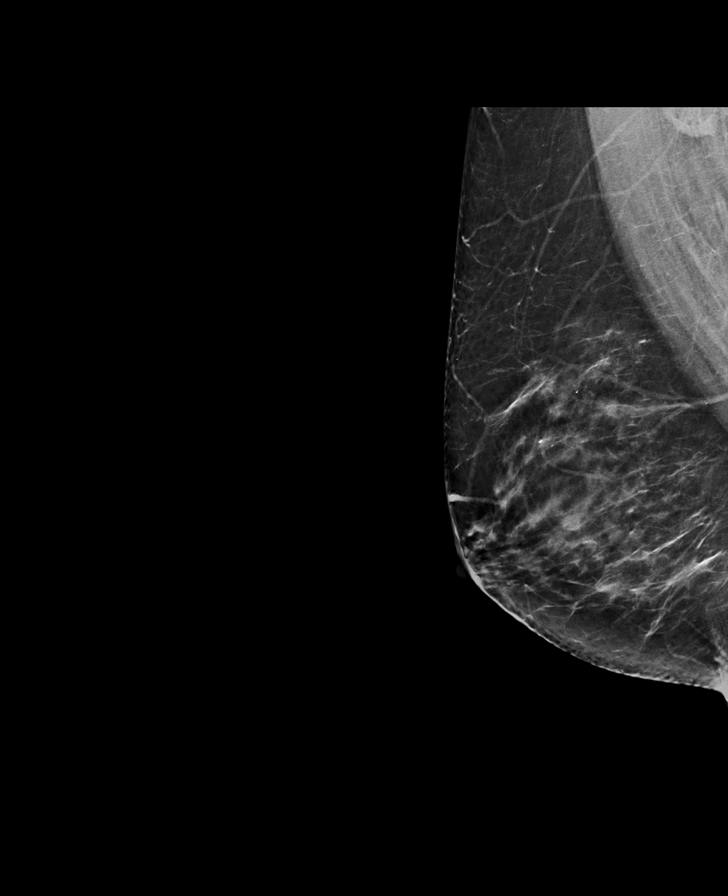

[L MLO synth-2D]
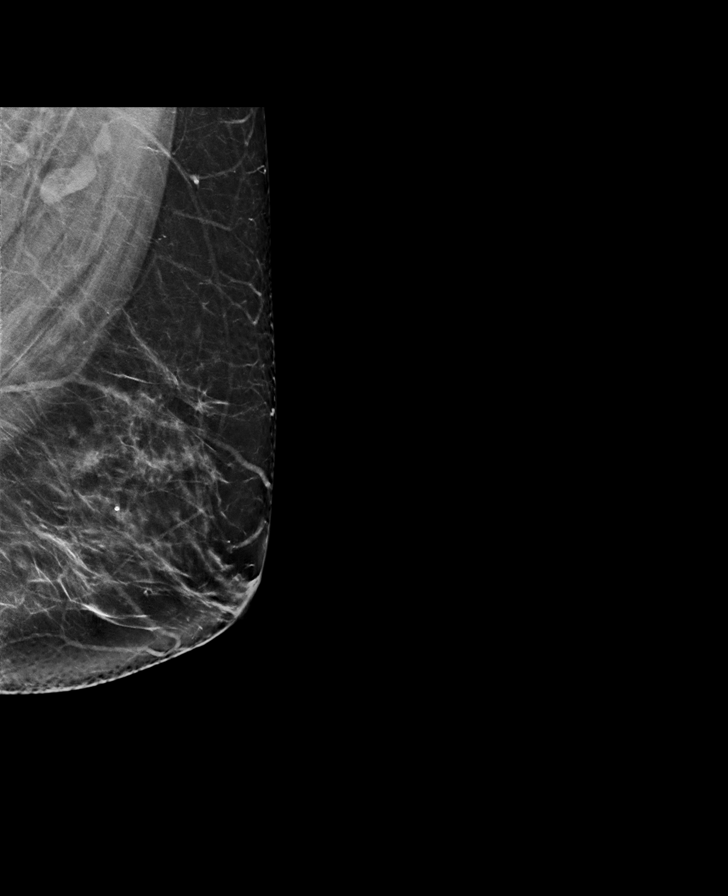

[L CC synth-2D]
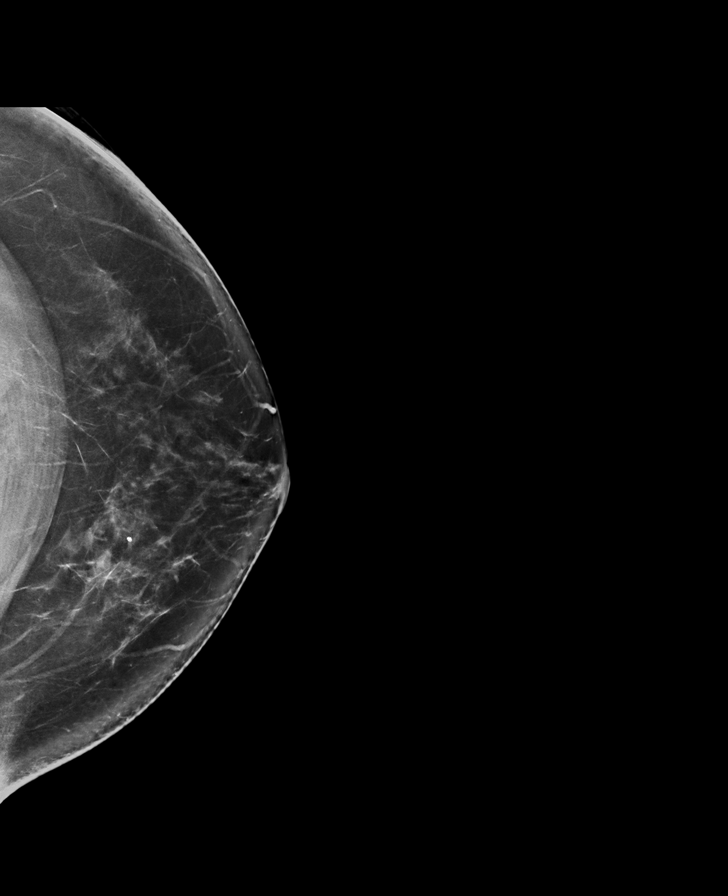

[R CC synth-2D]
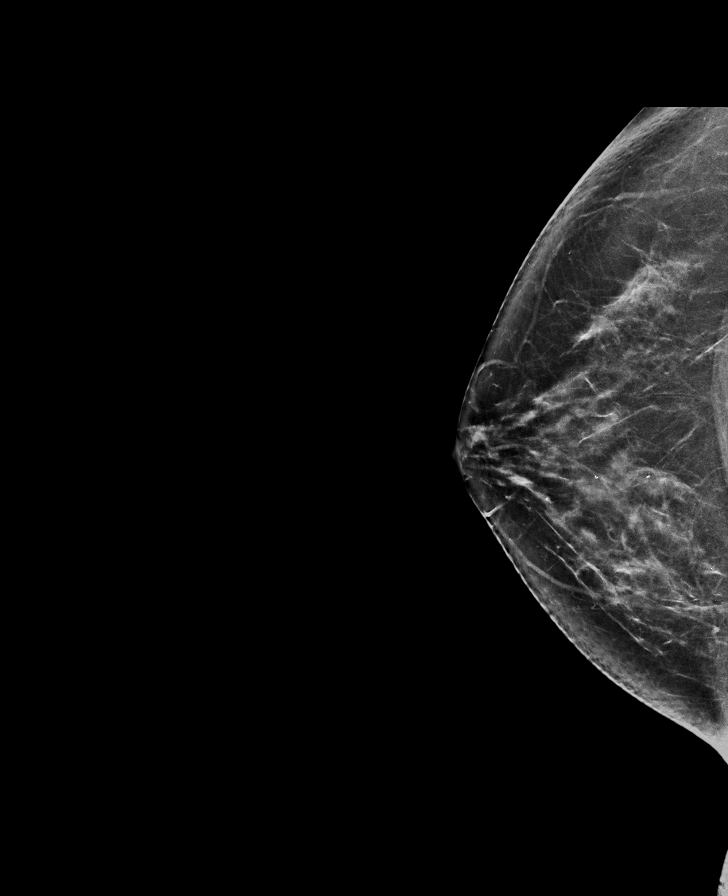

[R MLO tomo · tomo slice 39/76.0]
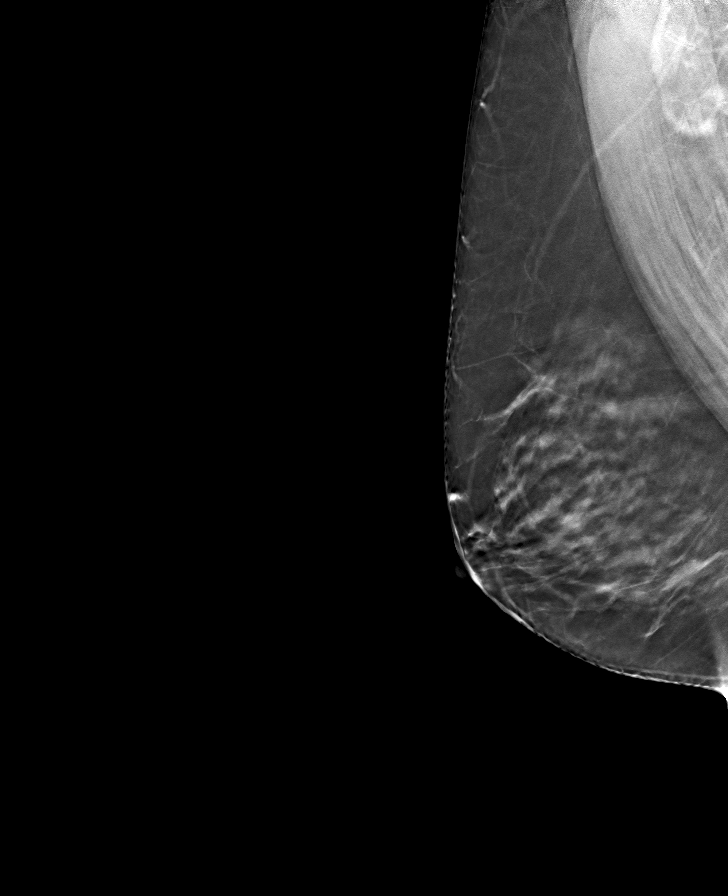

[L MLO tomo · tomo slice 41/80.0]
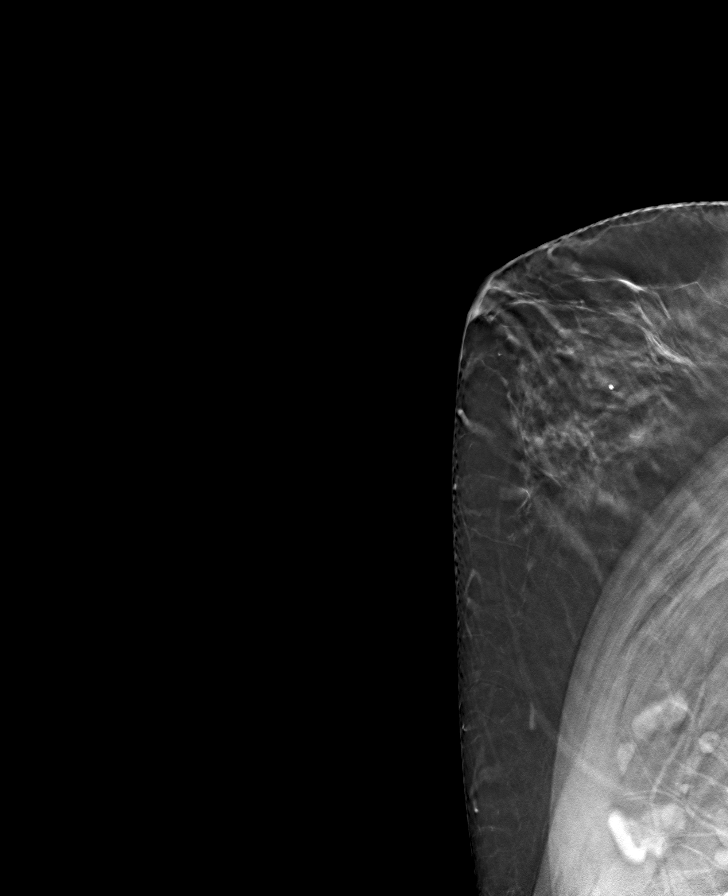

[L CC tomo · tomo slice 43/85.0]
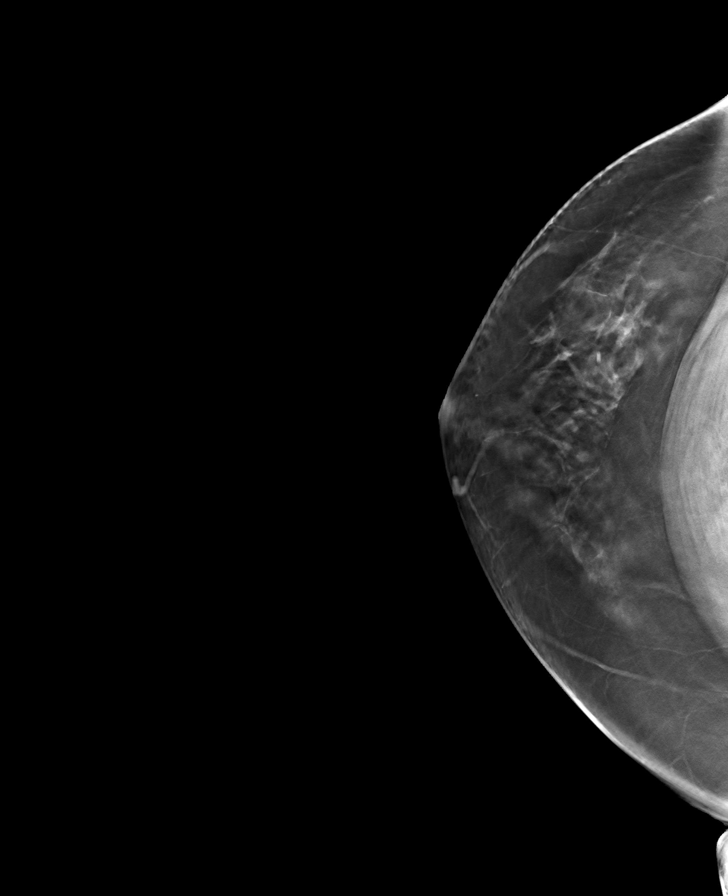

[R CC tomo · tomo slice 39/76.0]
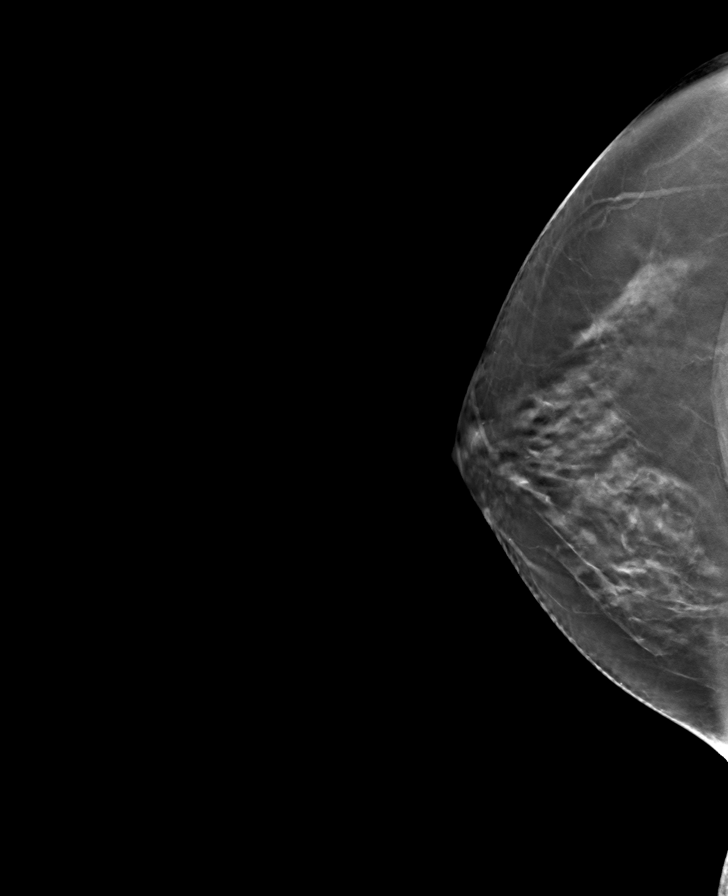

[8 of 24 positions shown; findings below may reference images not displayed]

ACR Breast Density Category c: The breast tissue is heterogeneously
dense, which may obscure small masses
FINDINGS: There are no findings suspicious for malignancy.
IMPRESSION: No mammographic evidence of malignancy. A result letter of this
screening mammogram will be mailed directly to the patient.

RECOMMENDATION:
Screening mammogram in one year. (Code:C8-T-HNK)

BI-RADS CATEGORY  1: Negative.

## 2022-10-08 ENCOUNTER — Other Ambulatory Visit: Payer: Self-pay | Admitting: Family

## 2022-10-08 DIAGNOSIS — I10 Essential (primary) hypertension: Secondary | ICD-10-CM

## 2022-10-12 ENCOUNTER — Other Ambulatory Visit: Payer: Self-pay | Admitting: Family

## 2022-10-12 DIAGNOSIS — I10 Essential (primary) hypertension: Secondary | ICD-10-CM

## 2022-10-24 ENCOUNTER — Other Ambulatory Visit: Payer: Self-pay | Admitting: Family

## 2022-10-24 DIAGNOSIS — I10 Essential (primary) hypertension: Secondary | ICD-10-CM

## 2022-11-05 ENCOUNTER — Other Ambulatory Visit: Payer: Self-pay | Admitting: Family

## 2023-01-05 ENCOUNTER — Other Ambulatory Visit: Payer: Self-pay | Admitting: Family

## 2023-01-05 DIAGNOSIS — I10 Essential (primary) hypertension: Secondary | ICD-10-CM

## 2023-01-20 ENCOUNTER — Other Ambulatory Visit: Payer: Self-pay | Admitting: Family

## 2023-01-20 DIAGNOSIS — I10 Essential (primary) hypertension: Secondary | ICD-10-CM

## 2023-02-02 ENCOUNTER — Other Ambulatory Visit: Payer: Self-pay | Admitting: Family

## 2023-02-06 ENCOUNTER — Ambulatory Visit: Payer: Federal, State, Local not specified - PPO | Admitting: Family

## 2023-02-06 ENCOUNTER — Encounter: Payer: Self-pay | Admitting: Family Medicine

## 2023-02-06 ENCOUNTER — Ambulatory Visit: Payer: Federal, State, Local not specified - PPO | Admitting: Family Medicine

## 2023-02-06 VITALS — BP 118/76 | HR 67 | Temp 98.2°F | Ht 61.0 in | Wt 168.0 lb

## 2023-02-06 DIAGNOSIS — E785 Hyperlipidemia, unspecified: Secondary | ICD-10-CM | POA: Diagnosis not present

## 2023-02-06 DIAGNOSIS — I10 Essential (primary) hypertension: Secondary | ICD-10-CM | POA: Diagnosis not present

## 2023-02-06 DIAGNOSIS — Z1231 Encounter for screening mammogram for malignant neoplasm of breast: Secondary | ICD-10-CM

## 2023-02-06 DIAGNOSIS — Z6831 Body mass index (BMI) 31.0-31.9, adult: Secondary | ICD-10-CM

## 2023-02-06 DIAGNOSIS — E669 Obesity, unspecified: Secondary | ICD-10-CM | POA: Diagnosis not present

## 2023-02-06 LAB — COMPREHENSIVE METABOLIC PANEL
ALT: 24 U/L (ref 0–35)
AST: 20 U/L (ref 0–37)
Albumin: 4.5 g/dL (ref 3.5–5.2)
Alkaline Phosphatase: 61 U/L (ref 39–117)
BUN: 13 mg/dL (ref 6–23)
CO2: 28 mEq/L (ref 19–32)
Calcium: 10 mg/dL (ref 8.4–10.5)
Chloride: 102 mEq/L (ref 96–112)
Creatinine, Ser: 0.74 mg/dL (ref 0.40–1.20)
GFR: 91.57 mL/min (ref >60.00)
Glucose, Bld: 95 mg/dL (ref 70–99)
Potassium: 4.1 mEq/L (ref 3.5–5.1)
Sodium: 139 mEq/L (ref 135–145)
Total Bilirubin: 0.5 mg/dL (ref 0.2–1.2)
Total Protein: 7.7 g/dL (ref 6.0–8.3)

## 2023-02-06 LAB — LIPID PANEL
Cholesterol: 126 mg/dL (ref 0–200)
HDL: 44.9 mg/dL (ref >39.00)
LDL Cholesterol: 46 mg/dL (ref 0–99)
NonHDL: 80.95
Total CHOL/HDL Ratio: 3
Triglycerides: 175 mg/dL — ABNORMAL HIGH (ref 0.0–149.0)
VLDL: 35 mg/dL (ref 0.0–40.0)

## 2023-02-06 LAB — HEMOGLOBIN A1C: Hgb A1c MFr Bld: 5.8 % (ref 4.6–6.5)

## 2023-02-06 NOTE — Assessment & Plan Note (Signed)
Chronic issue.  Continue diet and exercise changes.

## 2023-02-06 NOTE — Patient Instructions (Signed)
Nice to meet you. Please start checking your blood pressure a few times a week.  Your goal is less than 130/80. Please call [phone] to schedule your mammogram.

## 2023-02-06 NOTE — Assessment & Plan Note (Signed)
Chronic issue.  Continue Crestor 10 mg daily.  Check lab work.

## 2023-02-06 NOTE — Progress Notes (Unsigned)
Marikay Alar, MD Phone: [phone]  Shelley Massey is a 57 y.o. female who presents today for f/u.  HYPERTENSION Disease Monitoring Home BP Monitoring 120s. Though has not checked in the past month. Chest pain- no    Dyspnea- no Medications Compliance-  taking atenolol, hydrochlorothiazide, amlodipine.  Edema- no BMET    Component Value Date/Time   NA 141 07/27/2022 0941   K 3.9 07/27/2022 0941   CL 103 07/27/2022 0941   CO2 30 07/27/2022 0941   GLUCOSE 105 (H) 07/27/2022 0941   BUN 13 07/27/2022 0941   CREATININE 0.65 07/27/2022 0941   CREATININE 0.61 11/13/2013 1453   CALCIUM 9.4 07/27/2022 0941   GFRNONAA >60 01/05/2015 1625   GFRNONAA >89 11/13/2013 1453   GFRAA >60 01/05/2015 1625   GFRAA >89 11/13/2013 1453   HYPERLIPIDEMIA Symptoms Chest pain on exertion:  no   Leg claudication:   no Medications: Compliance- taking crestor Right upper quadrant pain- no  Muscle aches- no Lipid Panel     Component Value Date/Time   CHOL 122 01/26/2022 0937   TRIG 92.0 01/26/2022 0937   HDL 43.10 01/26/2022 0937   CHOLHDL 3 01/26/2022 0937   VLDL 18.4 01/26/2022 0937   LDLCALC 60 01/26/2022 0937   LDLDIRECT 208.0 01/14/2021 0901   Obesity: Patient notes she stopped metformin.  This was causing her bowel movements to be off.  She has kept her weight down despite stopping the metformin.  She has reduced portion sizes.  She is been walking daily at work.  She also has an under desk bike that has been helpful.    Social History   Tobacco Use  Smoking Status Never  Smokeless Tobacco Never    Current Outpatient Medications on File Prior to Visit  Medication Sig Dispense Refill   amLODipine (NORVASC) 10 MG tablet Take 1 tablet by mouth once daily 90 tablet 3   aspirin 81 MG tablet Take 81 mg by mouth daily.     atenolol (TENORMIN) 50 MG tablet TAKE 1 TABLET BY MOUTH ONCE DAILY. APPOINTMENT REQUIRED FOR FUTURE REFILLS 90 tablet 0   cetirizine (ZYRTEC ALLERGY) 10 MG  tablet Take 1 tablet (10 mg total) by mouth daily. While congestion persists 90 tablet 1   cholecalciferol (VITAMIN D) 1000 UNITS tablet Take 1,000 Units by mouth daily.     cyclobenzaprine (FLEXERIL) 5 MG tablet Take 1 tablet (5 mg total) by mouth at bedtime. 30 tablet 0   escitalopram (LEXAPRO) 10 MG tablet Take 1 tablet (10 mg total) by mouth daily. 90 tablet 3   Flaxseed, Linseed, (FLAX SEED OIL PO) Take by mouth.     hydrochlorothiazide (HYDRODIURIL) 25 MG tablet Take 1 tablet by mouth once daily 90 tablet 0   Omega-3 Fatty Acids (FISH OIL) 1200 MG CAPS Take 2 capsules by mouth daily.     rosuvastatin (CRESTOR) 10 MG tablet TAKE 1 TABLET BY MOUTH ONCE DAILY IN THE EVENING 90 tablet 0   Vitamin E 400 UNITS TABS Take 2 tablets by mouth once daily 60 tablet 2   No current facility-administered medications on file prior to visit.     ROS see history of present illness  Objective  Physical Exam Vitals:   02/06/23 0950  BP: 134/80  Pulse: 67  Temp: 98.2 F (36.8 C)  SpO2: 97%    BP Readings from Last 3 Encounters:  02/06/23 134/80  07/27/22 120/78  03/09/22 126/80   Wt Readings from Last 3 Encounters:  02/06/23 168  lb (76.2 kg)  07/27/22 178 lb 3.2 oz (80.8 kg)  03/09/22 173 lb (78.5 kg)    Physical Exam Constitutional:      General: She is not in acute distress.    Appearance: She is not diaphoretic.  Cardiovascular:     Rate and Rhythm: Normal rate and regular rhythm.     Heart sounds: Normal heart sounds.  Pulmonary:     Effort: Pulmonary effort is normal.     Breath sounds: Normal breath sounds.  Musculoskeletal:     Right lower leg: No edema.     Left lower leg: No edema.  Skin:    General: Skin is warm and dry.  Neurological:     Mental Status: She is alert.   Chronic issue.   Assessment/Plan: Please see individual problem list.  Primary hypertension Assessment & Plan: Chronic issue.  Checking lipid panel to determine ASCVD risk percentage.   Discussed tentatively a goal of less than 130/80.  Encouraged her to start checking a few times a week.  She will continue amlodipine 10 mg daily, atenolol 50 mg daily, and HCTZ 25 mg daily.  Orders: -     Comprehensive metabolic panel  Hyperlipidemia, unspecified hyperlipidemia type Assessment & Plan: Chronic issue.  Continue Crestor 10 mg daily.  Check lab work.  Orders: -     Comprehensive metabolic panel -     Lipid panel  Obesity (BMI 30-39.9) Assessment & Plan: Chronic issue.  Continue diet and exercise changes.  Orders: -     Hemoglobin A1c  Encounter for screening mammogram for malignant neoplasm of breast -     3D Screening Mammogram, Left and Right; Future    Return in about 6 months (around 08/09/2023) for PCP for blood pressure.   Marikay Alar, MD [facility]

## 2023-02-06 NOTE — Assessment & Plan Note (Signed)
Chronic issue.  Checking lipid panel to determine ASCVD risk percentage.  Discussed tentatively a goal of less than 130/80.  Encouraged her to start checking a few times a week.  She will continue amlodipine 10 mg daily, atenolol 50 mg daily, and HCTZ 25 mg daily.

## 2023-02-16 ENCOUNTER — Ambulatory Visit
Admission: RE | Admit: 2023-02-16 | Discharge: 2023-02-16 | Disposition: A | Discharge status: Home / Self Care | Payer: Federal, State, Local not specified - PPO | Source: Ambulatory Visit | Attending: Family Medicine | Admitting: Family Medicine

## 2023-02-16 ENCOUNTER — Encounter: Payer: Self-pay | Admitting: Radiology

## 2023-02-16 DIAGNOSIS — Z1231 Encounter for screening mammogram for malignant neoplasm of breast: Secondary | ICD-10-CM | POA: Diagnosis not present

## 2023-03-13 ENCOUNTER — Other Ambulatory Visit: Payer: Self-pay | Admitting: Family

## 2023-03-13 DIAGNOSIS — R232 Flushing: Secondary | ICD-10-CM

## 2023-03-23 ENCOUNTER — Encounter: Payer: Self-pay | Admitting: Family

## 2023-03-23 ENCOUNTER — Ambulatory Visit: Payer: Federal, State, Local not specified - PPO

## 2023-03-23 ENCOUNTER — Ambulatory Visit: Payer: Federal, State, Local not specified - PPO | Admitting: Family

## 2023-03-23 VITALS — BP 122/70 | HR 76 | Temp 98.2°F | Resp 16 | Ht 61.0 in | Wt 173.4 lb

## 2023-03-23 DIAGNOSIS — R41840 Attention and concentration deficit: Secondary | ICD-10-CM

## 2023-03-23 DIAGNOSIS — G8929 Other chronic pain: Secondary | ICD-10-CM | POA: Diagnosis not present

## 2023-03-23 DIAGNOSIS — M25561 Pain in right knee: Secondary | ICD-10-CM

## 2023-03-23 DIAGNOSIS — M1711 Unilateral primary osteoarthritis, right knee: Secondary | ICD-10-CM | POA: Diagnosis not present

## 2023-03-23 LAB — TSH: TSH: 2.71 u[IU]/mL (ref 0.35–5.50)

## 2023-03-23 LAB — B12 AND FOLATE PANEL
Folate: 9.3 ng/mL (ref >5.9)
Vitamin B-12: 243 pg/mL (ref 211–911)

## 2023-03-23 MED ORDER — OMEPRAZOLE 20 MG PO CPDR: 20.0000 mg | DELAYED_RELEASE_CAPSULE | Freq: Every day | ORAL | 3 refills | Status: DC

## 2023-03-23 MED ORDER — MELOXICAM 7.5 MG PO TABS
7.5000 mg | ORAL_TABLET | Freq: Every day | ORAL | 1 refills | Status: DC | PRN
Start: 2023-03-23 — End: 2023-07-28

## 2023-03-23 MED ORDER — BUPROPION HCL ER (XL) 150 MG PO TB24
150.0000 mg | ORAL_TABLET | Freq: Every day | ORAL | 1 refills | Status: DC
Start: 2023-03-23 — End: 2023-08-24

## 2023-03-23 NOTE — Progress Notes (Signed)
Assessment & Plan:  Concentration deficit Assessment & Plan: Etiology nonspecific at this time.  Denies depression.  Question if brain fog related to concentration deficit.  Trial of Wellbutrin 150 mg.  Counseled on side effects. Pending labs. Close follow up.   Orders: -     TSH -     B12 and Folate Panel -     buPROPion HCl ER (XL); Take 1 tablet (150 mg total) by mouth daily.  Dispense: 90 tablet; Refill: 1  Chronic pain of right knee Assessment & Plan: Patient is a consistent with meniscal etiology versus arthritis.  We discussed conservative management including meloxicam, icing, Ace wrap.  Provided Prilosec to take while she is taking meloxicam to prevent gastritis.  If conservative therapy is not effective, we discussed referral to orthopedics.  Provided knee exercises on after visit summary  Orders: -     DG Knee Complete 4 Views Right; Future -     Omeprazole; Take 1 capsule (20 mg total) by mouth daily.  Dispense: 30 capsule; Refill: 3 -     Meloxicam; Take 1 tablet (7.5 mg total) by mouth daily as needed for pain.  Dispense: 30 tablet; Refill: 1     Return precautions given.   Risks, benefits, and alternatives of the medications and treatment plan prescribed today were discussed, and patient expressed understanding.   Education regarding symptom management and diagnosis given to patient on AVS either electronically or printed.  Return in about 6 weeks (around 05/04/2023).  Rennie Plowman, FNP  Subjective:    Patient ID: Shelley Massey, female    DOB: [birth year], 57 y.o.   MRN: [id number]  CC: Shelley Massey is a 57 y.o. female who presents today for follow up.   HPI: Complains of right medial knee pain  Knee hasn't given way. No swelling.  She notices when walking, she is limping on right side and feels 'catching'.   No injury  She is using treadmill under her desk.  She is using exercise tape on knee with temporary relief.    Knee pain improves with  rest.   Compliant with amlodipine 10 mg daily, atenolol 50 mg daily, and HCTZ 25 mg daily.   Reports right knee meniscal tear years ago.  Surgical repair 2001  She also complains of feeling more 'brain fog' of late.   She hasnt forgotten loves ones names. Example may be if she sets something down , she may forget where she placed the item the room. She is doing well at work, at times , can feel overwhelmed.   Denies depression.  Sleeping well.   No h/o seizure, heavy alochol use, anorexia or bulminia.   Allergies: Patient has no known allergies. Current Outpatient Medications on File Prior to Visit  Medication Sig Dispense Refill   amLODipine (NORVASC) 10 MG tablet Take 1 tablet by mouth once daily 90 tablet 3   aspirin 81 MG tablet Take 81 mg by mouth daily.     atenolol (TENORMIN) 50 MG tablet TAKE 1 TABLET BY MOUTH ONCE DAILY. APPOINTMENT REQUIRED FOR FUTURE REFILLS 90 tablet 0   cetirizine (ZYRTEC ALLERGY) 10 MG tablet Take 1 tablet (10 mg total) by mouth daily. While congestion persists 90 tablet 1   cholecalciferol (VITAMIN D) 1000 UNITS tablet Take 1,000 Units by mouth daily.     cyclobenzaprine (FLEXERIL) 5 MG tablet Take 1 tablet (5 mg total) by mouth at bedtime. 30 tablet 0   escitalopram (LEXAPRO) 10 MG tablet  Take 1 tablet by mouth once daily 90 tablet 0   Flaxseed, Linseed, (FLAX SEED OIL PO) Take by mouth.     hydrochlorothiazide (HYDRODIURIL) 25 MG tablet Take 1 tablet by mouth once daily 90 tablet 0   Omega-3 Fatty Acids (FISH OIL) 1200 MG CAPS Take 2 capsules by mouth daily.     rosuvastatin (CRESTOR) 10 MG tablet TAKE 1 TABLET BY MOUTH ONCE DAILY IN THE EVENING 90 tablet 0   Vitamin E 400 UNITS TABS Take 2 tablets by mouth once daily 60 tablet 2   No current facility-administered medications on file prior to visit.    Review of Systems  Constitutional:  Negative for chills and fever.  Respiratory:  Negative for cough.   Cardiovascular:  Negative for chest pain  and palpitations.  Gastrointestinal:  Negative for nausea and vomiting.  Musculoskeletal:  Positive for arthralgias.      Objective:    BP 122/70   Pulse 76   Temp 98.2 F (36.8 C)   Resp 16   Ht 5\' 1"  (1.549 m)   Wt 173 lb 6.4 oz (78.7 kg)   LMP  (LMP Unknown)   SpO2 98%   BMI 32.76 kg/m  BP Readings from Last 3 Encounters:  03/23/23 122/70  02/06/23 118/76  07/27/22 120/78   Wt Readings from Last 3 Encounters:  03/23/23 173 lb 6.4 oz (78.7 kg)  02/06/23 168 lb (76.2 kg)  07/27/22 178 lb 3.2 oz (80.8 kg)    Physical Exam Vitals reviewed.  Constitutional:      Appearance: She is well-developed.  Eyes:     Conjunctiva/sclera: Conjunctivae normal.  Cardiovascular:     Rate and Rhythm: Normal rate and regular rhythm.     Pulses: Normal pulses.     Heart sounds: Normal heart sounds.  Pulmonary:     Effort: Pulmonary effort is normal.     Breath sounds: Normal breath sounds. No wheezing, rhonchi or rales.  Musculoskeletal:     Right knee: No swelling. Normal range of motion. No tenderness.     Left knee: No swelling. Normal range of motion. No tenderness.     Comments: Bilateral knees are symmetric. No effusion appreciated. No increase in warmth or erythema. Crepitus felt with flexion of bilateral knees.  Right knee:  Able to extend to -5 to 10 degrees and flex to 110 degrees. No catching with McMurray maneuver. No patellar apprehension. Negative anterior drawer and lachman's- no laxity appreciated.  No calf tenderness of lower leg edema bilaterally.    Skin:    General: Skin is warm and dry.  Neurological:     Mental Status: She is alert.  Psychiatric:        Speech: Speech normal.        Behavior: Behavior normal.        Thought Content: Thought content normal.

## 2023-03-23 NOTE — Assessment & Plan Note (Signed)
Patient is a consistent with meniscal etiology versus arthritis.  We discussed conservative management including meloxicam, icing, Ace wrap.  Provided Prilosec to take while she is taking meloxicam to prevent gastritis.  If conservative therapy is not effective, we discussed referral to orthopedics.  Provided knee exercises on after visit summary

## 2023-03-23 NOTE — Patient Instructions (Signed)
We will start meloxicam which is a potent anti-inflammatory similar to ibuprofen for your knee.  Please take consistently for the next couple of weeks.  On the days you take meloxicam please also take Prilosec to protect your stomach.  A couple of points in regards to meloxicam ( Mobic) -  This medication is not intended for daily , long term use. It is a potent anti inflammatory ( NSAID), and my intention is for you take as needed for moderate to severe pain. If you find yourself using daily, please let me know.   Please takes Mobic ( meloxicam) with FOOD since it is an anti-inflammatory as it can cause a GI bleed or ulcer. If you have a history of GI bleed or ulcer, please do NOT take.  Do no take over the counter aleve, motrin, advil, goody's powder for pain as they are also NSAIDs, and they are  in the same class as Mobic  Lastly, we will need to monitor kidney function while on Mobic, and if we were to see any decline in kidney function in the future, we would have to discontinue this medication.    .  For the brain fog, I would like to trial Wellbutrin which is stimulating .  If you feel at all this medication is too stimulating and you feel more irritable or anxious, please let me know.   Knee Exercises Ask your health care provider which exercises are safe for you. Do exercises exactly as told by your health care provider and adjust them as directed. It is normal to feel mild stretching, pulling, tightness, or discomfort as you do these exercises. Stop right away if you feel sudden pain or your pain gets worse. Do not begin these exercises until told by your health care provider. Stretching and range-of-motion exercises These exercises warm up your muscles and joints and improve the movement and flexibility of your knee. These exercises also help to relieve pain and swelling. Knee extension, prone  Lie on your abdomen (prone position) on a bed. Place your left / right knee just beyond  the edge of the surface so your knee is not on the bed. You can put a towel under your left / right thigh just above your kneecap for comfort. Relax your leg muscles and allow gravity to straighten your knee (extension). You should feel a stretch behind your left / right knee. Hold this position for __________ seconds. Scoot up so your knee is supported between repetitions. Repeat __________ times. Complete this exercise __________ times a day. Knee flexion, active  Lie on your back with both legs straight. If this causes back discomfort, bend your left / right knee so your foot is flat on the floor. Slowly slide your left / right heel back toward your buttocks. Stop when you feel a gentle stretch in the front of your knee or thigh (flexion). Hold this position for __________ seconds. Slowly slide your left / right heel back to the starting position. Repeat __________ times. Complete this exercise __________ times a day. Quadriceps stretch, prone  Lie on your abdomen on a firm surface, such as a bed or padded floor. Bend your left / right knee and hold your ankle. If you cannot reach your ankle or pant leg, loop a belt around your foot and grab the belt instead. Gently pull your heel toward your buttocks. Your knee should not slide out to the side. You should feel a stretch in the front of your thigh and  knee (quadriceps). Hold this position for __________ seconds. Repeat __________ times. Complete this exercise __________ times a day. Hamstring, supine  Lie on your back (supine position). Loop a belt or towel over the ball of your left / right foot. The ball of your foot is on the walking surface, right under your toes. Straighten your left / right knee and slowly pull on the belt to raise your leg until you feel a gentle stretch behind your knee (hamstring). Do not let your knee bend while you do this. Keep your other leg flat on the floor. Hold this position for __________  seconds. Repeat __________ times. Complete this exercise __________ times a day. Strengthening exercises These exercises build strength and endurance in your knee. Endurance is the ability to use your muscles for a long time, even after they get tired. Quadriceps, isometric This exercise strengthens the muscles in front of your thigh (quadriceps) without moving your knee joint (isometric). Lie on your back with your left / right leg extended and your other knee bent. Put a rolled towel or small pillow under your knee if told by your health care provider. Slowly tense the muscles in the front of your left / right thigh. You should see your kneecap slide up toward your hip or see increased dimpling just above the knee. This motion will push the back of the knee toward the floor. For __________ seconds, hold the muscle as tight as you can without increasing your pain. Relax the muscles slowly and completely. Repeat __________ times. Complete this exercise __________ times a day. Straight leg raises This exercise strengthens the muscles in front of your thigh (quadriceps) and the muscles that move your hips (hip flexors). Lie on your back with your left / right leg extended and your other knee bent. Tense the muscles in the front of your left / right thigh. You should see your kneecap slide up or see increased dimpling just above the knee. Your thigh may even shake a bit. Keep these muscles tight as you raise your leg 4-6 inches (10-15 cm) off the floor. Do not let your knee bend. Hold this position for __________ seconds. Keep these muscles tense as you lower your leg. Relax your muscles slowly and completely after each repetition. Repeat __________ times. Complete this exercise __________ times a day. Hamstring, isometric  Lie on your back on a firm surface. Bend your left / right knee about __________ degrees. Dig your left / right heel into the surface as if you are trying to pull it toward  your buttocks. Tighten the muscles in the back of your thighs (hamstring) to "dig" as hard as you can without increasing any pain. Hold this position for __________ seconds. Release the tension gradually and allow your muscles to relax completely for __________ seconds after each repetition. Repeat __________ times. Complete this exercise __________ times a day. Hamstring curls If told by your health care provider, do this exercise while wearing ankle weights. Begin with __________lb / kg weights. Then increase the weight by 1 lb (0.5 kg) increments. Do not wear ankle weights that are more than __________lb / kg. Lie on your abdomen with your legs straight. Bend your left / right knee as far as you can without feeling pain. Keep your hips flat against the floor. Hold this position for __________ seconds. Slowly lower your leg to the starting position. Repeat __________ times. Complete this exercise __________ times a day. Squats This exercise strengthens the muscles in front of  your thigh and knee (quadriceps). Stand in front of a table, with your feet and knees pointing straight ahead. You may rest your hands on the table for balance but not for support. Slowly bend your knees and lower your hips like you are going to sit in a chair. Keep your weight over your heels, not over your toes. Keep your lower legs upright so they are parallel with the table legs. Do not let your hips go lower than your knees. Do not bend lower than told by your health care provider. If your knee pain increases, do not bend as low. Hold the squat position for __________ seconds. Slowly push with your legs to return to standing. Do not use your hands to pull yourself to standing. Repeat __________ times. Complete this exercise __________ times a day. Wall slides This exercise strengthens the muscles in front of your thigh and knee (quadriceps). Lean your back against a smooth wall or door, and walk your feet out  18-24 inches (46-61 cm) from it. Place your feet hip-width apart. Slowly slide down the wall or door until your knees bend __________ degrees. Keep your knees over your heels, not over your toes. Keep your knees in line with your hips. Hold this position for __________ seconds. Repeat __________ times. Complete this exercise __________ times a day. Straight leg raises, side-lying This exercise strengthens the muscles that rotate the leg at the hip and move it away from your body (hip abductors). Lie on your side with your left / right leg in the top position. Lie so your head, shoulder, knee, and hip line up. You may bend your bottom knee to help you keep your balance. Roll your hips slightly forward so your hips are stacked directly over each other and your left / right knee is facing forward. Leading with your heel, lift your top leg 4-6 inches (10-15 cm). You should feel the muscles in your outer hip lifting. Do not let your foot drift forward. Do not let your knee roll toward the ceiling. Hold this position for __________ seconds. Slowly return your leg to the starting position. Let your muscles relax completely after each repetition. Repeat __________ times. Complete this exercise __________ times a day. Straight leg raises, prone This exercise stretches the muscles that move your hips away from the front of the pelvis (hip extensors). Lie on your abdomen on a firm surface. You can put a pillow under your hips if that is more comfortable. Tense the muscles in your buttocks and lift your left / right leg about 4-6 inches (10-15 cm). Keep your knee straight as you lift your leg. Hold this position for __________ seconds. Slowly lower your leg to the starting position. Let your leg relax completely after each repetition. Repeat __________ times. Complete this exercise __________ times a day. This information is not intended to replace advice given to you by your health care provider. Make  sure you discuss any questions you have with your health care provider. Document Revised: 02/23/2021 Document Reviewed: 02/23/2021 Elsevier Patient Education  2024 [employer].

## 2023-03-23 NOTE — Assessment & Plan Note (Signed)
Etiology nonspecific at this time.  Denies depression.  Question if brain fog related to concentration deficit.  Trial of Wellbutrin 150 mg.  Counseled on side effects. Pending labs. Close follow up.

## 2023-03-26 ENCOUNTER — Other Ambulatory Visit: Payer: Self-pay | Admitting: Family

## 2023-03-26 DIAGNOSIS — E538 Deficiency of other specified B group vitamins: Secondary | ICD-10-CM | POA: Insufficient documentation

## 2023-04-03 ENCOUNTER — Other Ambulatory Visit: Payer: Self-pay | Admitting: Family

## 2023-04-05 ENCOUNTER — Other Ambulatory Visit: Payer: Self-pay | Admitting: Family

## 2023-04-05 DIAGNOSIS — I10 Essential (primary) hypertension: Secondary | ICD-10-CM

## 2023-04-17 ENCOUNTER — Other Ambulatory Visit: Payer: Self-pay | Admitting: Family

## 2023-04-17 DIAGNOSIS — I10 Essential (primary) hypertension: Secondary | ICD-10-CM

## 2023-04-19 ENCOUNTER — Other Ambulatory Visit (INDEPENDENT_AMBULATORY_CARE_PROVIDER_SITE_OTHER): Payer: Federal, State, Local not specified - PPO

## 2023-04-19 DIAGNOSIS — E538 Deficiency of other specified B group vitamins: Secondary | ICD-10-CM | POA: Diagnosis not present

## 2023-04-21 LAB — CELIAC DISEASE AB SCREEN W/RFX
Antigliadin Abs, IgA: 3 U (ref 0–19)
IgA/Immunoglobulin A, Serum: 390 mg/dL — ABNORMAL HIGH (ref 87–352)
Transglutaminase IgA: <2 U/mL (ref 0–3)

## 2023-04-22 LAB — INTRINSIC FACTOR ANTIBODIES: Intrinsic Factor: NEGATIVE

## 2023-04-22 LAB — HOMOCYSTEINE: Homocysteine: 10.1 umol/L (ref <10.4)

## 2023-04-22 LAB — METHYLMALONIC ACID, SERUM: Methylmalonic Acid, Quant: 119 nmol/L (ref 55–335)

## 2023-04-29 ENCOUNTER — Other Ambulatory Visit: Payer: Self-pay | Admitting: Family

## 2023-05-02 ENCOUNTER — Telehealth: Payer: Self-pay | Admitting: *Deleted

## 2023-05-02 DIAGNOSIS — Z1211 Encounter for screening for malignant neoplasm of colon: Secondary | ICD-10-CM

## 2023-05-02 NOTE — Telephone Encounter (Signed)
Spoke to pt and informed her that we ordered Cologuard and she should receive it in the next 2-3 weeks let us know if you have not received it by then

## 2023-05-02 NOTE — Telephone Encounter (Signed)
Last Cologuard negative 2021 okay to order have pended order.

## 2023-05-02 NOTE — Telephone Encounter (Signed)
Please let patient know that Cologuard is due and we have ordered.  Please let Shelley Massey know if she does not receive the Cologuard kit in the next 2 or 3-weeks

## 2023-05-04 ENCOUNTER — Ambulatory Visit: Payer: Federal, State, Local not specified - PPO | Admitting: Family

## 2023-05-08 ENCOUNTER — Telehealth: Payer: Self-pay

## 2023-05-08 NOTE — Telephone Encounter (Signed)
Lvm for pt to give office a call back in regards to labs   Elease Hashimoto,   Negative celiac screen, intrinsic factor.  Please continue B12 over-the-counter 1000 mcg daily   Regards, Claris Che

## 2023-05-08 NOTE — Telephone Encounter (Signed)
-----   Message from Rennie Plowman sent at 05/08/2023  9:12 AM EST ----- Call patient Patient has not viewed MyChart result note.  Please review my chart note in detail with patient.   Please let me know if questions

## 2023-05-22 ENCOUNTER — Ambulatory Visit: Payer: Federal, State, Local not specified - PPO | Admitting: Family

## 2023-05-24 LAB — COLOGUARD: COLOGUARD: NEGATIVE

## 2023-06-09 ENCOUNTER — Other Ambulatory Visit: Payer: Self-pay | Admitting: Family

## 2023-06-09 DIAGNOSIS — R232 Flushing: Secondary | ICD-10-CM

## 2023-07-05 ENCOUNTER — Encounter: Payer: Self-pay | Admitting: Family

## 2023-07-05 DIAGNOSIS — M79673 Pain in unspecified foot: Secondary | ICD-10-CM

## 2023-07-07 NOTE — Telephone Encounter (Signed)
 Referral placed and pt notified

## 2023-07-09 ENCOUNTER — Other Ambulatory Visit: Payer: Self-pay | Admitting: Family

## 2023-07-09 DIAGNOSIS — I10 Essential (primary) hypertension: Secondary | ICD-10-CM

## 2023-07-28 ENCOUNTER — Ambulatory Visit (INDEPENDENT_AMBULATORY_CARE_PROVIDER_SITE_OTHER): Payer: Commercial Managed Care - PPO | Admitting: Podiatry

## 2023-07-28 ENCOUNTER — Ambulatory Visit (INDEPENDENT_AMBULATORY_CARE_PROVIDER_SITE_OTHER): Payer: Federal, State, Local not specified - PPO

## 2023-07-28 DIAGNOSIS — M7751 Other enthesopathy of right foot: Secondary | ICD-10-CM | POA: Diagnosis not present

## 2023-07-28 DIAGNOSIS — M779 Enthesopathy, unspecified: Secondary | ICD-10-CM

## 2023-07-28 MED ORDER — METHYLPREDNISOLONE 4 MG PO TBPK: ORAL_TABLET | ORAL | 0 refills | Status: DC

## 2023-07-28 MED ORDER — MELOXICAM 15 MG PO TABS: 15.0000 mg | ORAL_TABLET | Freq: Every day | ORAL | 1 refills | Status: AC

## 2023-07-28 NOTE — Progress Notes (Unsigned)
   Chief Complaint  Patient presents with   Foot Pain    "I have pain in this toe and on the ball of the foot and the top." N - pain in toes and ball of foot L - dorsal and plantar 1-3 metatarsals D - 10 years O - off and on  C - intense pain, aches, swelling A - walking, standing, barefoot T - brace, insoles    HPI: 58 y.o. female presenting today for above complaint  Past Medical History:  Diagnosis Date   Elevated LFTs    Fatty liver    Hay fever    Hypertension    Wears glasses     Past Surgical History:  Procedure Laterality Date   CARPAL TUNNEL RELEASE Bilateral    GANGLION CYST EXCISION Left 2015   KNEE ARTHROSCOPY W/ MENISCAL REPAIR Right 2001   [place], s/p repair x 2   MASS EXCISION Left 08/12/2013   Procedure: EXCISION VOLAR MASS LEFT WRIST AND THUMB;  Surgeon: Marlowe Shores, MD;  Location: [facility];  Service: Orthopedics;  Laterality: Left;   TONSILLECTOMY     TRIGGER FINGER RELEASE Left 02/24/2017   Procedure: MINOR RELEASE TRIGGER FINGER/A-1 PULLEY;  Surgeon: Dairl Ponder, MD;  Location: [facility];  Service: Orthopedics;  Laterality: Left;   WISDOM TOOTH EXTRACTION      No Known Allergies   Physical Exam: General: The patient is alert and oriented x3 in no acute distress.  Dermatology: Skin is warm, dry and supple bilateral lower extremities.   Vascular: Palpable pedal pulses bilaterally. Capillary refill within normal limits.  No appreciable edema.  No erythema.  Neurological: Grossly intact via light touch  Musculoskeletal Exam: No pedal deformities noted.  There is some tenderness with palpation with limited range of motion and mild crepitus to the first MTP of the right foot consistent with arthritic change and DJD.  Radiographic Exam RT foot 07/28/2023:  Moderate degenerative changes noted to the first MTP of the right foot  Assessment/Plan of Care: 1.  Hallux limitus/arthritis first MTP right  -Patient  evaluated.  X-rays reviewed -Injection of 0.5 cc Celestone Soluspan injected in the first MTP right -Prescription for Medrol Dosepak -Prescription for meloxicam 15 mg daily after completion of the Dosepak -Advised against going barefoot.  Recommend good supportive tennis shoes and sneakers -Recommend OTC arch supports to support the medial longitudinal arch of the foot and offload pressure from the forefoot.  Recommended [employer] -Return to clinic as needed     Felecia Shelling, DPM [facility]  Dr. Felecia Shelling, DPM    [address] [place], [place] [address]                Office [phone]  Fax [phone]

## 2023-07-31 MED ORDER — BETAMETHASONE SOD PHOS & ACET 6 (3-3) MG/ML IJ SUSP
3.0000 mg | Freq: Once | INTRAMUSCULAR | Status: DC
Start: 2023-07-31 — End: 2023-12-26

## 2023-08-13 ENCOUNTER — Other Ambulatory Visit: Payer: Self-pay | Admitting: Family

## 2023-08-13 DIAGNOSIS — I10 Essential (primary) hypertension: Secondary | ICD-10-CM

## 2023-08-14 ENCOUNTER — Other Ambulatory Visit: Payer: Self-pay | Admitting: Family

## 2023-08-14 NOTE — Telephone Encounter (Signed)
Medication sent in for 30 days

## 2023-08-14 NOTE — Telephone Encounter (Unsigned)
Copied from CRM [id number]. Topic: Appointments - Appointment Scheduling >> Aug 14, 2023 10:45 AM Tiffany H wrote: Patient/patient representative is calling to schedule an appointment. Refer to attachments for appointment information.   hydrochlorothiazide (HYDRODIURIL) 25 MG tablet rosuvastatin (CRESTOR) 10 MG tablet  Patient is scheduled to see Claris Che on 08/16/23.

## 2023-08-16 ENCOUNTER — Ambulatory Visit: Payer: Federal, State, Local not specified - PPO | Admitting: Family

## 2023-08-23 ENCOUNTER — Ambulatory Visit: Payer: Federal, State, Local not specified - PPO | Admitting: Family

## 2023-08-24 ENCOUNTER — Encounter: Payer: Self-pay | Admitting: Family

## 2023-08-24 ENCOUNTER — Ambulatory Visit: Payer: Federal, State, Local not specified - PPO | Admitting: Family

## 2023-08-24 VITALS — BP 120/78 | HR 62 | Temp 97.9°F | Ht 61.0 in | Wt 181.8 lb

## 2023-08-24 DIAGNOSIS — I10 Essential (primary) hypertension: Secondary | ICD-10-CM | POA: Diagnosis not present

## 2023-08-24 DIAGNOSIS — H60501 Unspecified acute noninfective otitis externa, right ear: Secondary | ICD-10-CM | POA: Diagnosis not present

## 2023-08-24 DIAGNOSIS — G8929 Other chronic pain: Secondary | ICD-10-CM

## 2023-08-24 DIAGNOSIS — M25561 Pain in right knee: Secondary | ICD-10-CM | POA: Diagnosis not present

## 2023-08-24 DIAGNOSIS — J309 Allergic rhinitis, unspecified: Secondary | ICD-10-CM

## 2023-08-24 DIAGNOSIS — E785 Hyperlipidemia, unspecified: Secondary | ICD-10-CM

## 2023-08-24 DIAGNOSIS — R232 Flushing: Secondary | ICD-10-CM

## 2023-08-24 LAB — BASIC METABOLIC PANEL
BUN: 10 mg/dL (ref 6–23)
CO2: 29 meq/L (ref 19–32)
Calcium: 9.4 mg/dL (ref 8.4–10.5)
Chloride: 103 meq/L (ref 96–112)
Creatinine, Ser: 0.65 mg/dL (ref 0.40–1.20)
GFR: 99.27 mL/min (ref >60.00)
Glucose, Bld: 83 mg/dL (ref 70–99)
Potassium: 3.5 meq/L (ref 3.5–5.1)
Sodium: 141 meq/L (ref 135–145)

## 2023-08-24 MED ORDER — AMLODIPINE BESYLATE 10 MG PO TABS: 10.0000 mg | ORAL_TABLET | Freq: Every day | ORAL | 3 refills | Status: DC

## 2023-08-24 MED ORDER — ATENOLOL 50 MG PO TABS: ORAL_TABLET | ORAL | 3 refills | Status: DC

## 2023-08-24 MED ORDER — ESCITALOPRAM OXALATE 10 MG PO TABS: 10.0000 mg | ORAL_TABLET | Freq: Every day | ORAL | 3 refills | Status: DC

## 2023-08-24 MED ORDER — OMEPRAZOLE 20 MG PO CPDR
20.0000 mg | DELAYED_RELEASE_CAPSULE | Freq: Every day | ORAL | 3 refills | Status: DC
Start: 2023-08-24 — End: 2024-03-21

## 2023-08-24 MED ORDER — HYDROCHLOROTHIAZIDE 25 MG PO TABS: 25.0000 mg | ORAL_TABLET | Freq: Every day | ORAL | 3 refills | Status: DC

## 2023-08-24 NOTE — Progress Notes (Signed)
 Assessment & Plan:  Essential hypertension -     amLODIPine Besylate; Take 1 tablet (10 mg total) by mouth daily.  Dispense: 90 tablet; Refill: 3 -     Atenolol; TAKE 1 TABLET BY MOUTH ONCE DAILY . APPOINTMENT REQUIRED FOR FUTURE REFILLS  Dispense: 90 tablet; Refill: 3 -     hydroCHLOROthiazide; Take 1 tablet (25 mg total) by mouth daily.  Dispense: 90 tablet; Refill: 3 -     Basic metabolic panel  Acute otitis externa of right ear, unspecified type  Hot flashes -     Escitalopram Oxalate; Take 1 tablet (10 mg total) by mouth daily.  Dispense: 90 tablet; Refill: 3  Chronic pain of right knee -     Omeprazole; Take 1 capsule (20 mg total) by mouth daily.  Dispense: 30 capsule; Refill: 3  Primary hypertension Assessment & Plan: Chronic, stable.  Continue atenolol 50 mg qd,  hctz 25mg  qd, amlodipine 10mg  qd   Hyperlipidemia, unspecified hyperlipidemia type Assessment & Plan: Chronic, stable.  Continue Crestor 10 mg every day.   Allergic rhinitis, unspecified seasonality, unspecified trigger Assessment & Plan: Reassuring exam..  Patient based on duration of symptoms question whether viral or allergic in etiology. Advised to start oct Zyrtec.  She will let me know if no improvement or if symptoms were to worsen in which I advised this would warrant antibiotic.      Return precautions given.   Risks, benefits, and alternatives of the medications and treatment plan prescribed today were discussed, and patient expressed understanding.   Education regarding symptom management and diagnosis given to patient on AVS either electronically or printed.  Return in about 6 months (around 02/21/2024) for Complete Physical Exam.  Rennie Plowman, FNP  Subjective:    Patient ID: Shelley Massey, female    DOB: [birth year], 58 y.o.   MRN: [id number]  CC: Shelley Massey is a 58 y.o. female who presents today for follow up.   HPI: Complains of bilateral sore throat x 4 days, waxes and  wanes.   Sore throat in the morning.  Dull HA.  No fever, cough, wheezing, chills, vision loss.   She stopped taking zyrtec. She is using her netti pot.   She never started Wellbutrin    Allergies: Patient has no known allergies. Current Outpatient Medications on File Prior to Visit  Medication Sig Dispense Refill   aspirin 81 MG tablet Take 81 mg by mouth daily.     cetirizine (ZYRTEC ALLERGY) 10 MG tablet Take 1 tablet (10 mg total) by mouth daily. While congestion persists 90 tablet 1   cholecalciferol (VITAMIN D) 1000 UNITS tablet Take 1,000 Units by mouth daily.     cyclobenzaprine (FLEXERIL) 5 MG tablet Take 1 tablet (5 mg total) by mouth at bedtime. 30 tablet 0   Flaxseed, Linseed, (FLAX SEED OIL PO) Take by mouth.     meloxicam (MOBIC) 15 MG tablet Take 1 tablet (15 mg total) by mouth daily. 60 tablet 1   Omega-3 Fatty Acids (FISH OIL) 1200 MG CAPS Take 2 capsules by mouth daily.     rosuvastatin (CRESTOR) 10 MG tablet TAKE 1 TABLET BY MOUTH ONCE DAILY IN THE EVENING 30 tablet 0   Vitamin E 400 UNITS TABS Take 2 tablets by mouth once daily 60 tablet 2   Current Facility-Administered Medications on File Prior to Visit  Medication Dose Route Frequency Provider Last Rate Last Admin   betamethasone acetate-betamethasone sodium phosphate (CELESTONE) injection 3 mg  3 mg Intra-articular Once         Review of Systems  Constitutional:  Negative for chills and fever.  HENT:  Positive for sinus pressure and sore throat. Negative for trouble swallowing.   Respiratory:  Negative for cough, shortness of breath and wheezing.   Cardiovascular:  Negative for chest pain and palpitations.  Gastrointestinal:  Negative for nausea and vomiting.      Objective:    BP 120/78   Pulse 62   Temp 97.9 F (36.6 C) (Oral)   Ht 5\' 1"  (1.549 m)   Wt 181 lb 12.8 oz (82.5 kg)   LMP  (LMP Unknown)   SpO2 97%   BMI 34.35 kg/m  BP Readings from Last 3 Encounters:  08/24/23 120/78  03/23/23  122/70  02/06/23 118/76   Wt Readings from Last 3 Encounters:  08/24/23 181 lb 12.8 oz (82.5 kg)  03/23/23 173 lb 6.4 oz (78.7 kg)  02/06/23 168 lb (76.2 kg)    Physical Exam Vitals reviewed.  Constitutional:      Appearance: She is well-developed.  HENT:     Head: Normocephalic and atraumatic.     Right Ear: Hearing, tympanic membrane, ear canal and external ear normal. No decreased hearing noted. No drainage, swelling or tenderness. No middle ear effusion. No foreign body. Tympanic membrane is not erythematous or bulging.     Left Ear: Hearing, tympanic membrane, ear canal and external ear normal. No decreased hearing noted. No drainage, swelling or tenderness.  No middle ear effusion. No foreign body. Tympanic membrane is not erythematous or bulging.     Nose: Nose normal. No rhinorrhea.     Right Sinus: No maxillary sinus tenderness or frontal sinus tenderness.     Left Sinus: No maxillary sinus tenderness or frontal sinus tenderness.     Mouth/Throat:     Pharynx: Uvula midline. No oropharyngeal exudate or posterior oropharyngeal erythema.     Tonsils: No tonsillar abscesses.  Eyes:     Conjunctiva/sclera: Conjunctivae normal.  Cardiovascular:     Rate and Rhythm: Regular rhythm.     Pulses: Normal pulses.     Heart sounds: Normal heart sounds.  Pulmonary:     Effort: Pulmonary effort is normal.     Breath sounds: Normal breath sounds. No wheezing, rhonchi or rales.  Lymphadenopathy:     Head:     Right side of head: No submental, submandibular, tonsillar, preauricular, posterior auricular or occipital adenopathy.     Left side of head: No submental, submandibular, tonsillar, preauricular, posterior auricular or occipital adenopathy.     Cervical: No cervical adenopathy.  Skin:    General: Skin is warm and dry.  Neurological:     Mental Status: She is alert.  Psychiatric:        Speech: Speech normal.        Behavior: Behavior normal.        Thought Content: Thought  content normal.

## 2023-08-24 NOTE — Assessment & Plan Note (Signed)
 Reassuring exam..  Patient based on duration of symptoms question whether viral or allergic in etiology. Advised to start oct Zyrtec.  She will let me know if no improvement or if symptoms were to worsen in which I advised this would warrant antibiotic.

## 2023-08-24 NOTE — Assessment & Plan Note (Signed)
 Chronic, stable.  Continue atenolol 50 mg qd,  hctz '25mg'$  qd, amlodipine '10mg'$  qd

## 2023-08-24 NOTE — Assessment & Plan Note (Signed)
 Chronic, stable.  Continue Crestor 10 mg every day.

## 2023-08-24 NOTE — Assessment & Plan Note (Deleted)
 Chronic, stable.  Continue atenolol 50 mg qd,  hctz '25mg'$  qd, amlodipine '10mg'$  qd

## 2023-08-24 NOTE — Patient Instructions (Signed)
Restart zyrtec  

## 2023-09-13 ENCOUNTER — Other Ambulatory Visit: Payer: Self-pay | Admitting: Family

## 2023-10-26 ENCOUNTER — Other Ambulatory Visit: Payer: Self-pay | Admitting: Family

## 2023-12-03 ENCOUNTER — Other Ambulatory Visit: Payer: Self-pay | Admitting: Family

## 2023-12-19 ENCOUNTER — Ambulatory Visit: Admitting: Family

## 2023-12-19 ENCOUNTER — Ambulatory Visit

## 2023-12-19 ENCOUNTER — Encounter: Payer: Self-pay | Admitting: Family

## 2023-12-19 VITALS — BP 120/76 | HR 68 | Temp 98.2°F | Ht 61.0 in | Wt 181.0 lb

## 2023-12-19 DIAGNOSIS — M533 Sacrococcygeal disorders, not elsewhere classified: Secondary | ICD-10-CM

## 2023-12-19 DIAGNOSIS — R110 Nausea: Secondary | ICD-10-CM | POA: Diagnosis not present

## 2023-12-19 DIAGNOSIS — M47816 Spondylosis without myelopathy or radiculopathy, lumbar region: Secondary | ICD-10-CM | POA: Diagnosis not present

## 2023-12-19 DIAGNOSIS — I10 Essential (primary) hypertension: Secondary | ICD-10-CM | POA: Diagnosis not present

## 2023-12-19 DIAGNOSIS — I878 Other specified disorders of veins: Secondary | ICD-10-CM | POA: Diagnosis not present

## 2023-12-19 DIAGNOSIS — M545 Low back pain, unspecified: Secondary | ICD-10-CM | POA: Diagnosis not present

## 2023-12-19 DIAGNOSIS — Z1231 Encounter for screening mammogram for malignant neoplasm of breast: Secondary | ICD-10-CM

## 2023-12-19 MED ORDER — ONDANSETRON HCL 4 MG PO TABS: 4.0000 mg | ORAL_TABLET | Freq: Three times a day (TID) | ORAL | 0 refills | Status: AC | PRN
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
## 2023-12-20 ENCOUNTER — Ambulatory Visit: Payer: Self-pay | Admitting: Family

## 2023-12-20 LAB — BASIC METABOLIC PANEL WITH GFR
BUN: 16 mg/dL (ref 6–23)
CO2: 26 meq/L (ref 19–32)
Calcium: 9.7 mg/dL (ref 8.4–10.5)
Chloride: 103 meq/L (ref 96–112)
Creatinine, Ser: 0.74 mg/dL (ref 0.40–1.20)
GFR: 91.01 mL/min (ref >60.00)
Glucose, Bld: 122 mg/dL — ABNORMAL HIGH (ref 70–99)
Potassium: 3.5 meq/L (ref 3.5–5.1)
Sodium: 139 meq/L (ref 135–145)

## 2023-12-21 NOTE — Assessment & Plan Note (Signed)
 Presentation consistent with SI joint dysfunction.  Pending baseline lumbar x-ray.  Continue Flexeril  5 mg as needed.  Consider physical therapy.

## 2023-12-21 NOTE — Assessment & Plan Note (Signed)
 Chronic, stable.  Continue atenolol 50 mg qd,  hctz '25mg'$  qd, amlodipine '10mg'$  qd

## 2023-12-21 NOTE — Patient Instructions (Signed)
 Pending x-ray low back.  Please consider physical therapy if back pain persist or you have recurrent episodes.  Nice seeing you today

## 2023-12-26 ENCOUNTER — Ambulatory Visit: Admitting: Family

## 2023-12-26 ENCOUNTER — Ambulatory Visit: Admitting: Podiatry

## 2023-12-26 ENCOUNTER — Encounter: Payer: Self-pay | Admitting: Podiatry

## 2023-12-26 VITALS — Ht 61.0 in | Wt 181.0 lb

## 2023-12-26 DIAGNOSIS — M2021 Hallux rigidus, right foot: Secondary | ICD-10-CM | POA: Diagnosis not present

## 2023-12-26 DIAGNOSIS — M7751 Other enthesopathy of right foot: Secondary | ICD-10-CM | POA: Diagnosis not present

## 2023-12-26 MED ORDER — MELOXICAM 15 MG PO TABS: 15.0000 mg | ORAL_TABLET | Freq: Every day | ORAL | 1 refills | Status: DC

## 2023-12-26 MED ORDER — BETAMETHASONE SOD PHOS & ACET 6 (3-3) MG/ML IJ SUSP
3.0000 mg | Freq: Once | INTRAMUSCULAR | Status: AC
  Administered 2023-12-26: 3 mg via INTRA_ARTICULAR

## 2023-12-26 MED ORDER — METHYLPREDNISOLONE 4 MG PO TBPK: ORAL_TABLET | ORAL | 0 refills | Status: DC

## 2023-12-26 NOTE — Progress Notes (Signed)
   Chief Complaint  Patient presents with   Injections    Pt is here for injection into right foot due to pain.    HPI: 58 y.o. female presenting today for follow-up evaluation of pain and tenderness associated to the right great toe joint.  She continues to have pain and tenderness despite conservative care.  Presenting for further treatment and evaluation  Past Medical History:  Diagnosis Date   Elevated LFTs    Fatty liver    Hay fever    Hypertension    Wears glasses     Past Surgical History:  Procedure Laterality Date   CARPAL TUNNEL RELEASE Bilateral    GANGLION CYST EXCISION Left 2015   KNEE ARTHROSCOPY W/ MENISCAL REPAIR Right 2001   [place] , s/p repair x 2   MASS EXCISION Left 08/12/2013   Procedure: EXCISION VOLAR MASS LEFT WRIST AND THUMB;  Surgeon: Donnice DELENA Robinsons, MD;  Location: [facility];  Service: Orthopedics;  Laterality: Left;   TONSILLECTOMY     TRIGGER FINGER RELEASE Left 02/24/2017   Procedure: MINOR RELEASE TRIGGER FINGER/A-1 PULLEY;  Surgeon: Robinsons Donnice, MD;  Location: [facility];  Service: Orthopedics;  Laterality: Left;   WISDOM TOOTH EXTRACTION      No Known Allergies   Physical Exam: General: The patient is alert and oriented x3 in no acute distress.  Dermatology: Skin is warm, dry and supple bilateral lower extremities.   Vascular: Palpable pedal pulses bilaterally. Capillary refill within normal limits.  No appreciable edema.  No erythema. With Neurological: Grossly intact via light touch  Musculoskeletal Exam: No pedal deformities noted.  There continues to be significant pain and tenderness with palpation and range of motion of the first MTP of the right foot Radiographic Exam RT foot 07/28/2023:  Moderate degenerative changes noted to the first MTP of the right foot  Assessment/Plan of Care: 1.  Hallux limitus/rigidus right first MTP 2.  First MTP capsulitis right  -Patient evaluated.  X-rays  reviewed -Injection of 0.5 cc Celestone  Soluspan injected in the first MTP right -Prescription for Medrol  Dosepak - Continue meloxicam  15 mg daily after completion of the Dosepak - Unfortunately the patient continues to have pain and tenderness associated to the right great toe joint which has been ongoing for several months despite conservative care.  She is very frustrated with the pain that she is experiencing.  Is affecting her daily quality of life.  I do believe that surgery at this time is warranted -Today we discussed surgery including arthroplasty versus arthrodesis of the great toe joint.  Risk benefits advantages and disadvantages of each modality were explained in detail.  The patient opts for arthroplasty with implant.  All patient questions were answered.  No guarantees were expressed or implied -Authorization for surgery was initiated today.  Surgery will consist of right great toe arthroplasty with implant -Return to clinic 1 week postop     Thresa EMERSON Sar, DPM [facility]  Dr. Thresa EMERSON Sar, DPM    [address] [place], [place] [address]                Office [phone]  Fax [phone]

## 2023-12-30 ENCOUNTER — Other Ambulatory Visit: Payer: Self-pay | Admitting: Family

## 2024-01-03 ENCOUNTER — Telehealth: Payer: Self-pay | Admitting: Podiatry

## 2024-01-03 NOTE — Telephone Encounter (Signed)
 Received surgical consent forms  Left message for pt to call me back to get her surgery scheduled.

## 2024-01-04 ENCOUNTER — Encounter: Payer: Self-pay | Admitting: Podiatry

## 2024-01-05 NOTE — Telephone Encounter (Signed)
 Mess from pt on MyChart inq about FMLA forms. Advised her fax,email or drop off at front desk.

## 2024-01-25 ENCOUNTER — Telehealth: Payer: Self-pay | Admitting: Podiatry

## 2024-01-25 NOTE — Telephone Encounter (Signed)
 Form placed in my mailbox at check-in and pt sent via MyChart. I adv her recd and would her know when completed and faxed to fax# on form

## 2024-01-25 NOTE — Telephone Encounter (Signed)
 DOS- 02/01/2024  KELLER BUNION IMPLANT RT- 71708  [employer] EFFECTIVE DATE- 08/25/2019  DEDUCTIBLE- N/A OOP- $7500 ACCUMULATED- $285.45 COINSURANCE- $150 COPAY WHEN IN OFFICE REF # [id number] 01/25/2024   PER AMANI B WITH [employer], NO PRIOR AUTH IS REQUIRED FOR CPT CODE 71708.  REF# [id number] 01/25/2024 10:39 EST

## 2024-01-29 ENCOUNTER — Other Ambulatory Visit: Payer: Self-pay | Admitting: Family

## 2024-01-29 DIAGNOSIS — Z0271 Encounter for disability determination: Secondary | ICD-10-CM

## 2024-01-29 NOTE — Telephone Encounter (Signed)
 cld pt to adv form completed. She wants it emailed to [email or web address]  DOS 02/01/24

## 2024-02-01 ENCOUNTER — Other Ambulatory Visit: Payer: Self-pay | Admitting: Podiatry

## 2024-02-01 DIAGNOSIS — G8918 Other acute postprocedural pain: Secondary | ICD-10-CM | POA: Diagnosis not present

## 2024-02-01 DIAGNOSIS — M7751 Other enthesopathy of right foot: Secondary | ICD-10-CM | POA: Diagnosis not present

## 2024-02-01 DIAGNOSIS — M19071 Primary osteoarthritis, right ankle and foot: Secondary | ICD-10-CM | POA: Diagnosis not present

## 2024-02-01 DIAGNOSIS — M2021 Hallux rigidus, right foot: Secondary | ICD-10-CM | POA: Diagnosis not present

## 2024-02-01 MED ORDER — IBUPROFEN 800 MG PO TABS: 800.0000 mg | ORAL_TABLET | Freq: Three times a day (TID) | ORAL | 1 refills | Status: AC

## 2024-02-01 MED ORDER — OXYCODONE-ACETAMINOPHEN 5-325 MG PO TABS
1.0000 | ORAL_TABLET | ORAL | 0 refills | Status: DC | PRN
Start: 2024-02-01 — End: 2024-03-21

## 2024-02-01 NOTE — Progress Notes (Signed)
 PRN postop

## 2024-02-09 ENCOUNTER — Ambulatory Visit (INDEPENDENT_AMBULATORY_CARE_PROVIDER_SITE_OTHER): Admitting: Podiatry

## 2024-02-09 ENCOUNTER — Ambulatory Visit (INDEPENDENT_AMBULATORY_CARE_PROVIDER_SITE_OTHER)

## 2024-02-09 ENCOUNTER — Encounter: Payer: Self-pay | Admitting: Podiatry

## 2024-02-09 VITALS — Ht 61.0 in | Wt 181.0 lb

## 2024-02-09 DIAGNOSIS — M2021 Hallux rigidus, right foot: Secondary | ICD-10-CM

## 2024-02-09 NOTE — Progress Notes (Signed)
   Chief Complaint  Patient presents with   Routine Post Op    POV # 1 DOS 02/01/2024 RT GREAT TOE ARTHROPLASTY W/ IMPLANT Pt states everything is going well has no complaints.    Subjective:  Patient presents today status post right great toe arthroplasty with implant.  DOS: 02/01/2024.  Doing well.  WBAT in the cam boot as instructed  Past Medical History:  Diagnosis Date   Elevated LFTs    Fatty liver    Hay fever    Hypertension    Wears glasses     Past Surgical History:  Procedure Laterality Date   CARPAL TUNNEL RELEASE Bilateral    GANGLION CYST EXCISION Left 2015   KNEE ARTHROSCOPY W/ MENISCAL REPAIR Right 2001   [place] , s/p repair x 2   MASS EXCISION Left 08/12/2013   Procedure: EXCISION VOLAR MASS LEFT WRIST AND THUMB;  Surgeon: Donnice DELENA Robinsons, MD;  Location: [facility];  Service: Orthopedics;  Laterality: Left;   TONSILLECTOMY     TRIGGER FINGER RELEASE Left 02/24/2017   Procedure: MINOR RELEASE TRIGGER FINGER/A-1 PULLEY;  Surgeon: Robinsons Donnice, MD;  Location: [facility];  Service: Orthopedics;  Laterality: Left;   WISDOM TOOTH EXTRACTION      No Known Allergies  Objective/Physical Exam Neurovascular status intact.  Incision well coapted with sutures intact. No sign of infectious process noted. No dehiscence. No active bleeding noted.  Moderate edema noted to the surgical extremity.  Radiographic Exam RT foot 02/09/2024:  Orthopedic Silastic implant and osteotomies sites appear to be stable with routine healing.  Good alignment of the first ray  Assessment: 1. s/p right great toe arthroplasty with implant. DOS: 02/01/2024   Plan of Care:  -Patient was evaluated. X-rays reviewed - Okay to wash and shower and get the foot wet -Continue WBAT in the cam boot -Return to clinic 1 week suture removal   Thresa EMERSON Sar, DPM [facility]  Dr. Thresa EMERSON Sar, DPM    [address] [place], [place] [address]                Office [phone]  Fax [phone]

## 2024-02-16 ENCOUNTER — Encounter: Payer: Self-pay | Admitting: Podiatry

## 2024-02-16 ENCOUNTER — Ambulatory Visit (INDEPENDENT_AMBULATORY_CARE_PROVIDER_SITE_OTHER): Admitting: Podiatry

## 2024-02-16 VITALS — Ht 61.0 in | Wt 181.0 lb

## 2024-02-16 DIAGNOSIS — M2021 Hallux rigidus, right foot: Secondary | ICD-10-CM

## 2024-02-25 ENCOUNTER — Other Ambulatory Visit: Payer: Self-pay | Admitting: Family

## 2024-02-27 NOTE — Telephone Encounter (Signed)
 Lab appt schedule for 9/12 at 4pm and f/u appt scheduled for 9/25 at 2:30 pm

## 2024-02-27 NOTE — Telephone Encounter (Signed)
 LVM to call back to schedule f/up and lab appt, please schedule when pt calls back and document

## 2024-02-28 NOTE — Progress Notes (Signed)
   Chief Complaint  Patient presents with   Routine Post Op     POV # 2 DOS 02/01/2024 RT GREAT TOE ARTHROPLASTY W/ IMPLANT, pt is here to f/u on rt foot after surgery she states everything is going well and has no complaints.     Subjective:  Patient presents today status post right great toe arthroplasty with implant.  DOS: 02/01/2024.  Unchanged.  Doing well.  WBAT in the cam boot as instructed  Past Medical History:  Diagnosis Date   Elevated LFTs    Fatty liver    Hay fever    Hypertension    Wears glasses     Past Surgical History:  Procedure Laterality Date   CARPAL TUNNEL RELEASE Bilateral    GANGLION CYST EXCISION Left 2015   KNEE ARTHROSCOPY W/ MENISCAL REPAIR Right 2001   [place] , s/p repair x 2   MASS EXCISION Left 08/12/2013   Procedure: EXCISION VOLAR MASS LEFT WRIST AND THUMB;  Surgeon: Donnice DELENA Robinsons, MD;  Location: [facility];  Service: Orthopedics;  Laterality: Left;   TONSILLECTOMY     TRIGGER FINGER RELEASE Left 02/24/2017   Procedure: MINOR RELEASE TRIGGER FINGER/A-1 PULLEY;  Surgeon: Robinsons Donnice, MD;  Location: [facility];  Service: Orthopedics;  Laterality: Left;   WISDOM TOOTH EXTRACTION      No Known Allergies  Objective/Physical Exam Neurovascular status intact.  Incision well coapted with sutures intact. No sign of infectious process noted. No dehiscence. No active bleeding noted.  Minimal edema noted to the surgical extremity.  No appreciable tenderness with palpation or range of motion of the great toe joint  Radiographic Exam RT foot 02/09/2024:  Orthopedic Silastic implant and osteotomies sites appear to be stable with routine healing.  Good alignment of the first ray  Assessment: 1. s/p right great toe arthroplasty with implant. DOS: 02/01/2024   Plan of Care:  -Patient was evaluated. -Sutures removed -Continue WBAT CAM boot.  In 2 weeks she may begin to transition out of the cam boot into supportive tennis  shoes and sneakers -Return to clinic 4 weeks follow-up x-ray   Thresa EMERSON Sar, DPM [facility]  Dr. Thresa EMERSON Sar, DPM    [address] [place], [place] [address]                Office [phone]  Fax [phone]

## 2024-02-29 ENCOUNTER — Telehealth: Payer: Self-pay | Admitting: Family

## 2024-02-29 DIAGNOSIS — E559 Vitamin D deficiency, unspecified: Secondary | ICD-10-CM

## 2024-02-29 DIAGNOSIS — E669 Obesity, unspecified: Secondary | ICD-10-CM

## 2024-02-29 DIAGNOSIS — E785 Hyperlipidemia, unspecified: Secondary | ICD-10-CM

## 2024-02-29 DIAGNOSIS — I10 Essential (primary) hypertension: Secondary | ICD-10-CM

## 2024-02-29 NOTE — Telephone Encounter (Signed)
Orders have been placed and pt is aware.

## 2024-02-29 NOTE — Telephone Encounter (Signed)
 Lab order needed

## 2024-02-29 NOTE — Addendum Note (Signed)
 Addended by: HARRIETTE RAISIN on: 02/29/2024 03:31 PM   Modules accepted: Orders

## 2024-02-29 NOTE — Addendum Note (Signed)
 Addended by: HARRIETTE RAISIN on: 02/29/2024 03:24 PM   Modules accepted: Orders

## 2024-03-01 ENCOUNTER — Encounter: Admitting: Podiatry

## 2024-03-08 ENCOUNTER — Other Ambulatory Visit

## 2024-03-08 ENCOUNTER — Other Ambulatory Visit (INDEPENDENT_AMBULATORY_CARE_PROVIDER_SITE_OTHER)

## 2024-03-08 ENCOUNTER — Other Ambulatory Visit: Payer: Self-pay

## 2024-03-08 ENCOUNTER — Ambulatory Visit

## 2024-03-08 ENCOUNTER — Ambulatory Visit (INDEPENDENT_AMBULATORY_CARE_PROVIDER_SITE_OTHER): Admitting: Podiatry

## 2024-03-08 DIAGNOSIS — M2021 Hallux rigidus, right foot: Secondary | ICD-10-CM

## 2024-03-08 DIAGNOSIS — E785 Hyperlipidemia, unspecified: Secondary | ICD-10-CM

## 2024-03-08 DIAGNOSIS — E669 Obesity, unspecified: Secondary | ICD-10-CM | POA: Diagnosis not present

## 2024-03-08 DIAGNOSIS — I10 Essential (primary) hypertension: Secondary | ICD-10-CM

## 2024-03-08 DIAGNOSIS — E559 Vitamin D deficiency, unspecified: Secondary | ICD-10-CM

## 2024-03-08 LAB — CBC WITH DIFFERENTIAL/PLATELET
Basophils Absolute: 0 K/uL (ref 0.0–0.1)
Basophils Relative: 0.8 % (ref 0.0–3.0)
Eosinophils Absolute: 0.1 K/uL (ref 0.0–0.7)
Eosinophils Relative: 1.8 % (ref 0.0–5.0)
HCT: 43.8 % (ref 36.0–46.0)
Hemoglobin: 14.7 g/dL (ref 12.0–15.0)
Lymphocytes Relative: 33.8 % (ref 12.0–46.0)
Lymphs Abs: 1.8 K/uL (ref 0.7–4.0)
MCHC: 33.5 g/dL (ref 30.0–36.0)
MCV: 88.1 fl (ref 78.0–100.0)
Monocytes Absolute: 0.4 K/uL (ref 0.1–1.0)
Monocytes Relative: 7.3 % (ref 3.0–12.0)
Neutro Abs: 3.1 K/uL (ref 1.4–7.7)
Neutrophils Relative %: 56.3 % (ref 43.0–77.0)
Platelets: 341 K/uL (ref 150.0–400.0)
RBC: 4.98 Mil/uL (ref 3.87–5.11)
RDW: 13.4 % (ref 11.5–15.5)
WBC: 5.4 K/uL (ref 4.0–10.5)

## 2024-03-08 LAB — LIPID PANEL
Cholesterol: 125 mg/dL (ref 0–200)
HDL: 43.9 mg/dL (ref >39.00)
LDL Cholesterol: 46 mg/dL (ref 0–99)
NonHDL: 81.09
Total CHOL/HDL Ratio: 3
Triglycerides: 174 mg/dL — ABNORMAL HIGH (ref 0.0–149.0)
VLDL: 34.8 mg/dL (ref 0.0–40.0)

## 2024-03-08 LAB — HEPATIC FUNCTION PANEL
ALT: 27 U/L (ref 0–35)
AST: 20 U/L (ref 0–37)
Albumin: 4.7 g/dL (ref 3.5–5.2)
Alkaline Phosphatase: 57 U/L (ref 39–117)
Bilirubin, Direct: 0.1 mg/dL (ref 0.0–0.3)
Total Bilirubin: 0.5 mg/dL (ref 0.2–1.2)
Total Protein: 7.4 g/dL (ref 6.0–8.3)

## 2024-03-08 LAB — HEMOGLOBIN A1C: Hgb A1c MFr Bld: 6.4 % (ref 4.6–6.5)

## 2024-03-08 LAB — VITAMIN D 25 HYDROXY (VIT D DEFICIENCY, FRACTURES): VITD: 37.34 ng/mL (ref 30.00–100.00)

## 2024-03-08 LAB — TSH: TSH: 6.24 u[IU]/mL — ABNORMAL HIGH (ref 0.35–5.50)

## 2024-03-08 NOTE — Progress Notes (Signed)
   Chief Complaint  Patient presents with   Routine Post Op    RT GREAT TOE ARTHROPLASTY W/ IMPLANT. She does not express any concern    Subjective:  Patient presents today status post right great toe arthroplasty with implant.  DOS: 02/01/2024.  Doing well.  Currently weightbearing in tennis shoes  Past Medical History:  Diagnosis Date   Elevated LFTs    Fatty liver    Hay fever    Hypertension    Wears glasses     Past Surgical History:  Procedure Laterality Date   CARPAL TUNNEL RELEASE Bilateral    GANGLION CYST EXCISION Left 2015   KNEE ARTHROSCOPY W/ MENISCAL REPAIR Right 2001   [place] , s/p repair x 2   MASS EXCISION Left 08/12/2013   Procedure: EXCISION VOLAR MASS LEFT WRIST AND THUMB;  Surgeon: Donnice DELENA Robinsons, MD;  Location: [facility];  Service: Orthopedics;  Laterality: Left;   TONSILLECTOMY     TRIGGER FINGER RELEASE Left 02/24/2017   Procedure: MINOR RELEASE TRIGGER FINGER/A-1 PULLEY;  Surgeon: Robinsons Donnice, MD;  Location: [facility];  Service: Orthopedics;  Laterality: Left;   WISDOM TOOTH EXTRACTION      No Known Allergies  Objective/Physical Exam Neurovascular status intact.  Incision is nicely healed.  No appreciable edema.  There is some limited range of motion to the great toe joint likely secondary to scar tissue  Radiographic Exam RT foot 03/08/2024:  Unchanged with routine healing.  Orthopedic Silastic implant and osteotomies sites appear to be stable with routine healing.  Good alignment of the first ray  Assessment: 1. s/p right great toe arthroplasty with implant. DOS: 02/01/2024   Plan of Care:  -Patient was evaluated. - Recommend daily range of motion exercises to the great toe joint to break up scar tissue and increased range of motion -Continue good supportive tennis shoes and sneakers -Return to clinic 6 weeks follow-up x-ray   Thresa EMERSON Sar, DPM [facility]  Dr. Thresa EMERSON Sar, DPM     [address] [place], [place] [address]                Office [phone]  Fax [phone]

## 2024-03-15 ENCOUNTER — Ambulatory Visit: Payer: Self-pay | Admitting: Family

## 2024-03-15 DIAGNOSIS — E669 Obesity, unspecified: Secondary | ICD-10-CM

## 2024-03-15 DIAGNOSIS — R7989 Other specified abnormal findings of blood chemistry: Secondary | ICD-10-CM

## 2024-03-16 ENCOUNTER — Encounter: Payer: Self-pay | Admitting: Family

## 2024-03-21 ENCOUNTER — Encounter: Payer: Self-pay | Admitting: Family

## 2024-03-21 ENCOUNTER — Ambulatory Visit: Admitting: Family

## 2024-03-21 VITALS — BP 120/62 | HR 71 | Temp 98.3°F | Ht 61.0 in | Wt 182.8 lb

## 2024-03-21 DIAGNOSIS — E669 Obesity, unspecified: Secondary | ICD-10-CM

## 2024-03-21 DIAGNOSIS — I10 Essential (primary) hypertension: Secondary | ICD-10-CM | POA: Diagnosis not present

## 2024-03-21 MED ORDER — ZEPBOUND 2.5 MG/0.5ML ~~LOC~~ SOAJ: 2.5000 mg | SUBCUTANEOUS | 2 refills | Status: DC

## 2024-03-21 NOTE — Progress Notes (Unsigned)
 Assessment & Plan:  There are no diagnoses linked to this encounter.   Return precautions given.   Risks, benefits, and alternatives of the medications and treatment plan prescribed today were discussed, and patient expressed understanding.   Education regarding symptom management and diagnosis given to patient on AVS either electronically or printed.  No follow-ups on file.  Rollene Northern, FNP  Subjective:    Patient ID: Shelley Massey, female    DOB: 08-Sep-1965, 58 y.o.   MRN: [id number]  CC: Shelley Massey is a 58 y.o. female who presents today for follow up.   HPI: HPI  Allergies: Patient has no known allergies. Current Outpatient Medications on File Prior to Visit  Medication Sig Dispense Refill   amLODipine  (NORVASC ) 10 MG tablet Take 1 tablet (10 mg total) by mouth daily. 90 tablet 3   atenolol  (TENORMIN ) 50 MG tablet TAKE 1 TABLET BY MOUTH ONCE DAILY . APPOINTMENT REQUIRED FOR FUTURE REFILLS 90 tablet 3   cholecalciferol (VITAMIN D ) 1000 UNITS tablet Take 1,000 Units by mouth daily.     cyclobenzaprine  (FLEXERIL ) 5 MG tablet Take 1 tablet (5 mg total) by mouth at bedtime. 30 tablet 0   escitalopram  (LEXAPRO ) 10 MG tablet Take 1 tablet (10 mg total) by mouth daily. 90 tablet 3   Flaxseed, Linseed, (FLAX SEED OIL PO) Take by mouth.     hydrochlorothiazide  (HYDRODIURIL ) 25 MG tablet Take 1 tablet (25 mg total) by mouth daily. 90 tablet 3   ibuprofen  (ADVIL ) 800 MG tablet Take 1 tablet (800 mg total) by mouth 3 (three) times daily. 60 tablet 1   Omega-3 Fatty Acids (FISH OIL) 1200 MG CAPS Take 2 capsules by mouth daily.     ondansetron  (ZOFRAN ) 4 MG tablet Take 1 tablet (4 mg total) by mouth every 8 (eight) hours as needed for nausea or vomiting. 20 tablet 0   rosuvastatin  (CRESTOR ) 10 MG tablet Take 1 tablet by mouth in the evening 90 tablet 3   Vitamin E  400 UNITS TABS Take 2 tablets by mouth once daily 60 tablet 2   aspirin 81 MG tablet Take 81 mg by mouth daily.      No current facility-administered medications on file prior to visit.    Review of Systems  Constitutional:  Negative for chills and fever.  Respiratory:  Negative for cough.   Cardiovascular:  Negative for chest pain and palpitations.  Gastrointestinal:  Negative for nausea and vomiting.      Objective:    BP 120/62   Pulse 71   Temp 98.3 F (36.8 C) (Oral)   Ht 5' 1 (1.549 m)   Wt 182 lb 12.8 oz (82.9 kg)   LMP  (LMP Unknown)   SpO2 94%   BMI 34.54 kg/m  BP Readings from Last 3 Encounters:  03/21/24 120/62  12/19/23 120/76  08/24/23 120/78   Wt Readings from Last 3 Encounters:  03/21/24 182 lb 12.8 oz (82.9 kg)  02/16/24 181 lb (82.1 kg)  02/09/24 181 lb (82.1 kg)    Physical Exam Vitals reviewed.  Constitutional:      Appearance: She is well-developed.  Eyes:     Conjunctiva/sclera: Conjunctivae normal.  Neck:     Thyroid : No thyroid  mass, thyromegaly or thyroid  tenderness.  Cardiovascular:     Rate and Rhythm: Normal rate and regular rhythm.     Pulses: Normal pulses.     Heart sounds: Normal heart sounds.  Pulmonary:     Effort: Pulmonary effort  is normal.     Breath sounds: Normal breath sounds. No wheezing, rhonchi or rales.  Skin:    General: Skin is warm and dry.  Neurological:     Mental Status: She is alert.  Psychiatric:        Speech: Speech normal.        Behavior: Behavior normal.        Thought Content: Thought content normal.

## 2024-03-21 NOTE — Patient Instructions (Signed)
 Trial Zepbound 2.5mg  once per week injected subcutaneously ( [place])  in stomach. Please clean with alcohol swab prior to injection and be sure to rotate site. You may schedule a nurse visit if you would like to first injection.   After 4 weeks, and if tolerated and weight loss has not reached 1-2 lbs per week, please increase to 5mg  once per week [place].    Please read information on medication below and remember black box warning that you may not take if you or a family member is diagnosed with thyroid cancer (medullary thyroid cancer), or multiple endocrine neoplasia.      Tirzepatide Injection (Weight Management) What is this medication? TIRZEPATIDE (tir ZEP a tide) promotes weight loss. It may also be used to maintain weight loss.  It works by decreasing appetite. Changes to diet and exercise are often combined with this medication. This medicine may be used for other purposes; ask your health care provider or pharmacist if you have questions. COMMON BRAND NAME(S): Zepbound What should I tell my care team before I take this medication? They need to know if you have any of these conditions: Diabetes Eye disease caused by diabetes Gallbladder disease Have or have had depression Have or have had pancreatitis Having surgery Kidney disease Personal or family history of MEN 2, a condition that causes endocrine gland tumors Personal or family history of thyroid cancer Stomach or intestine problems, such as problems digesting food Suicidal thoughts, plans, or attempt An unusual or allergic reaction to tirzepatide, other medications, foods, dyes, or preservatives Pregnant or trying to get pregnant Breastfeeding How should I use this medication? This medication is injected under the skin. You will be taught how to prepare and give it. Take it as directed on the prescription label. Keep taking it unless your care team tells you to stop. It is important that you put your used needles and syringes in  a special sharps container. Do not put them in a trash can. If you do not have a sharps container, call your pharmacist or care team to get one. A special MedGuide will be given to you by the pharmacist with each prescription and refill. Be sure to read this information carefully each time. This medication comes with INSTRUCTIONS FOR USE. Ask your pharmacist for directions on how to use this medication. Read the information carefully. Talk to your pharmacist or care team if you have questions. Talk to your care team about the use of this medication in children. Special care may be needed. Overdosage: If you think you have taken too much of this medicine contact a poison control center or emergency room at once. NOTE: This medicine is only for you. Do not share this medicine with others. What if I miss a dose? If you miss a dose, take it as soon as you can unless it is more than 4 days (96 hours) late. If it is more than 4 days late, skip the missed dose. Take the next dose at the normal time. Do not take 2 doses within 3 days (72 hours) of each other. What may interact with this medication? Certain medications for diabetes, such as insulin, glyburide, glipizide This medication may affect how other medications work. Talk with your care team about all of the medications you take. They may suggest changes to your treatment plan to lower the risk of side effects and to make sure your medications work as intended. This list may not describe all possible interactions. Give your health  care provider a list of all the medicines, herbs, non-prescription drugs, or dietary supplements you use. Also tell them if you smoke, drink alcohol, or use illegal drugs. Some items may interact with your medicine. What should I watch for while using this medication? Visit your care team for regular checks on your progress. Tell your care team if your condition does not start to get better or if it gets worse. Tell your care  team if you are taking medication to treat diabetes, such as insulin or glipizide. This may increase your risk of low blood sugar. Know the symptoms of low blood sugar and how to treat it. Talk to your care team about your risk of cancer. You may be more at risk for certain types of cancer if you take this medication. Talk to your care team right away if you have a lump or swelling in your neck, hoarseness that does not go away, trouble swallowing, shortness of breath, or trouble breathing. Make sure you stay hydrated while taking this medication. Drink water often. Eat fruits and veggies that have a high water content. Drink more water when it is hot or you are active. Talk to your care team right away if you have fever, infection, vomiting, diarrhea, or if you sweat a lot while taking this medication. The loss of too much body fluid may make it dangerous for you to take this medication. If you are going to need surgery or a procedure, tell your care team that you are taking this medication. Estrogen and progestin hormones that you take by mouth may not work as well while you are taking this medication. Switch to a non-oral contraceptive or add a barrier contraceptive for 4 weeks after starting this medication and after each dose increase. Talk to your care team about contraceptive options. They can help you find the option that works for you. What side effects may I notice from receiving this medication? Side effects that you should report to your care team as soon as possible: Allergic reactions or angioedema--skin rash, itching or hives, swelling of the face, eyes, lips, tongue, arms, or legs, trouble swallowing or breathing Bowel blockage--stomach cramping, unable to have a bowel movement or pass gas, loss of appetite, vomiting Change in vision Dehydration--increased thirst, dry mouth, feeling faint or lightheaded, headache, dark yellow or brown urine Gallbladder problems--severe stomach pain,  nausea, vomiting, fever Kidney injury--decrease in the amount of urine, swelling of the ankles, hands, or feet Pancreatitis--severe stomach pain that spreads to your back or gets worse after eating or when touched, fever, nausea, vomiting Thoughts of suicide or self-harm, worsening mood, feelings of depression Thyroid cancer--new mass or lump in the neck, pain or trouble swallowing, trouble breathing, hoarseness Side effects that usually do not require medical attention (report these to your care team if they continue or are bothersome): Diarrhea Loss of appetite Nausea Upset stomach This list may not describe all possible side effects. Call your doctor for medical advice about side effects. You may report side effects to FDA at 1-800-FDA-1088. Where should I keep my medication? Keep out of the reach of children and pets. Store in a refrigerator or at room temperature up to 30 degrees C (86 degrees F). Keep it in the original container. Protect from light. Refrigeration (preferred): Store in the refrigerator. Do not freeze. Get rid of any unused medication after the expiration date. Room temperature: This medication may be stored at room temperature for up to 21 days. If it is  stored at room temperature, get rid of any unused medication after 21 days or after it expires, whichever is first. To get rid of medications that are no longer needed or have expired: Take the medication to a medication take-back program. Check with your pharmacy or law enforcement to find a location. If you cannot return the medication, ask your pharmacist or care team how to get rid of this medication safely. NOTE: This sheet is a summary. It may not cover all possible information. If you have questions about this medicine, talk to your doctor, pharmacist, or health care provider.  2024 Elsevier/Gold Standard (2023-05-26 00:00:00)

## 2024-03-22 NOTE — Assessment & Plan Note (Signed)
 Chronic, stable.  Continue atenolol 50 mg qd,  hctz '25mg'$  qd, amlodipine '10mg'$  qd

## 2024-03-22 NOTE — Assessment & Plan Note (Signed)
 History of prediabetes.  Counseled on side effects, blackbox warning, mechanism action and titration of Zepbound .  Start Zepbound  2.5 mg and titrate.

## 2024-03-26 ENCOUNTER — Other Ambulatory Visit (HOSPITAL_COMMUNITY): Payer: Self-pay

## 2024-03-26 ENCOUNTER — Telehealth: Payer: Self-pay

## 2024-03-26 DIAGNOSIS — E669 Obesity, unspecified: Secondary | ICD-10-CM

## 2024-03-26 NOTE — Telephone Encounter (Signed)
 Pharmacy Patient Advocate Encounter   Received notification from Onbase that prior authorization for Zepbound  2.5MG /0.5ML pen-injectors  is required/requested.   Insurance verification completed.   The patient is insured through [facility] .   Per test claim: PA required; PA submitted to above mentioned insurance via Latent Key/confirmation #/[id number] Status is pending

## 2024-03-27 ENCOUNTER — Telehealth: Payer: Self-pay | Admitting: Family

## 2024-03-27 ENCOUNTER — Encounter: Payer: Self-pay | Admitting: Family

## 2024-03-27 NOTE — Telephone Encounter (Signed)
 Spoke to pt informed her that the Zepbound  has been denied she would like to go back to what she was taking before

## 2024-03-27 NOTE — Telephone Encounter (Unsigned)
 Copied from CRM #[id number]. Topic: Clinical - Medication Prior Auth >> Mar 27, 2024  9:26 AM Tiffini S wrote: Reason for CRM: Patient called [employer] and the insurance is still needing approval for tirzepatide  (ZEPBOUND ) 2.5 MG/0.5ML Pen- prior authorization  Patient is asking for call back for a update at [phone]

## 2024-03-27 NOTE — Telephone Encounter (Signed)
 Pharmacy Patient Advocate Encounter  Received notification from [facility] that Prior Authorization for Zepbound  2.5MG /0.5ML pen-injectors  has been DENIED.  See denial reason below. No denial letter attached in CMM. Will attach denial letter to Media tab once received.   PA #/Case ID/Reference #: [id number]

## 2024-03-27 NOTE — Telephone Encounter (Signed)
 Spoke to pt informed her that the Zepbound  has been denied, see previous patient call

## 2024-03-29 MED ORDER — METFORMIN HCL ER 500 MG PO TB24: 500.0000 mg | ORAL_TABLET | Freq: Every evening | ORAL | 2 refills | Status: DC

## 2024-03-29 NOTE — Telephone Encounter (Signed)
 Metformin  sent Please sch f/u in 6 -8 weeks so we can titrate dose She can increase from 1 tablet daily to 2 tablets daily in the next 2-3 weeks as long as tolerating

## 2024-03-29 NOTE — Telephone Encounter (Signed)
 Pt has been scheduled for 05/20/24

## 2024-03-29 NOTE — Telephone Encounter (Signed)
 Spoke to pt she was referring to Metformin 

## 2024-03-29 NOTE — Telephone Encounter (Signed)
 Pt is aware also scheduled f/up for 05/20/24

## 2024-03-29 NOTE — Telephone Encounter (Signed)
 LVM and also sent my chart message as well to inform pt of message below

## 2024-03-29 NOTE — Addendum Note (Signed)
 Addended by: DINEEN ROLLENE MATSU on: 03/29/2024 08:48 AM   Modules accepted: Orders

## 2024-04-03 ENCOUNTER — Other Ambulatory Visit (INDEPENDENT_AMBULATORY_CARE_PROVIDER_SITE_OTHER)

## 2024-04-03 DIAGNOSIS — R7989 Other specified abnormal findings of blood chemistry: Secondary | ICD-10-CM

## 2024-04-03 DIAGNOSIS — E669 Obesity, unspecified: Secondary | ICD-10-CM | POA: Diagnosis not present

## 2024-04-04 ENCOUNTER — Encounter: Payer: Self-pay | Admitting: Family

## 2024-04-04 ENCOUNTER — Ambulatory Visit: Payer: Self-pay | Admitting: Family

## 2024-04-04 LAB — TSH: TSH: 9.9 u[IU]/mL — ABNORMAL HIGH (ref 0.450–4.500)

## 2024-04-04 LAB — T3, FREE: T3, Free: 3.1 pg/mL (ref 2.0–4.4)

## 2024-04-04 LAB — T4, FREE: Free T4: 1.04 ng/dL (ref 0.82–1.77)

## 2024-04-05 ENCOUNTER — Other Ambulatory Visit: Payer: Self-pay

## 2024-04-05 DIAGNOSIS — R7989 Other specified abnormal findings of blood chemistry: Secondary | ICD-10-CM

## 2024-04-05 DIAGNOSIS — E669 Obesity, unspecified: Secondary | ICD-10-CM

## 2024-04-05 NOTE — Telephone Encounter (Signed)
 Called pt schedule her lab appt 05/17/2024

## 2024-04-19 ENCOUNTER — Encounter: Payer: Self-pay | Admitting: Podiatry

## 2024-04-19 ENCOUNTER — Encounter: Payer: Self-pay | Admitting: Family

## 2024-04-19 ENCOUNTER — Ambulatory Visit (INDEPENDENT_AMBULATORY_CARE_PROVIDER_SITE_OTHER)

## 2024-04-19 ENCOUNTER — Ambulatory Visit (INDEPENDENT_AMBULATORY_CARE_PROVIDER_SITE_OTHER): Admitting: Podiatry

## 2024-04-19 VITALS — Ht 61.0 in | Wt 182.8 lb

## 2024-04-19 DIAGNOSIS — M2021 Hallux rigidus, right foot: Secondary | ICD-10-CM

## 2024-04-19 NOTE — Progress Notes (Signed)
   Chief Complaint  Patient presents with   Routine Post Op    POV #4 DOS 02/01/2024 RT GREAT TOE ARTHROPLASTY W/ IMPLANT, pt is here to f/u on right foot after surgery she states still some swelling and aches to the foot.    Subjective:  Patient presents today status post right great toe arthroplasty with implant.  DOS: 02/01/2024.  Continues to do well.  No new complaints  Past Medical History:  Diagnosis Date   Elevated LFTs    Fatty liver    Hay fever    Hypertension    Wears glasses     Past Surgical History:  Procedure Laterality Date   CARPAL TUNNEL RELEASE Bilateral    GANGLION CYST EXCISION Left 2015   KNEE ARTHROSCOPY W/ MENISCAL REPAIR Right 2001   [place] , s/p repair x 2   MASS EXCISION Left 08/12/2013   Procedure: EXCISION VOLAR MASS LEFT WRIST AND THUMB;  Surgeon: Donnice DELENA Robinsons, MD;  Location: [facility];  Service: Orthopedics;  Laterality: Left;   TONSILLECTOMY     TRIGGER FINGER RELEASE Left 02/24/2017   Procedure: MINOR RELEASE TRIGGER FINGER/A-1 PULLEY;  Surgeon: Robinsons Donnice, MD;  Location: [facility];  Service: Orthopedics;  Laterality: Left;   WISDOM TOOTH EXTRACTION      No Known Allergies  Objective/Physical Exam Neurovascular status intact.  Incision is nicely healed.  No edema.  Good range of motion of the first MTP  Radiographic Exam RT foot 04/19/2024:  Unchanged with routine healing.  Orthopedic Silastic implant and osteotomies sites appear to be stable with routine healing.  Good alignment of the first ray  Assessment: 1. s/p right great toe arthroplasty with implant. DOS: 02/01/2024   Plan of Care:  -Patient was evaluated.  X-rays reviewed - Full activity no restrictions and good supportive tennis shoes and sneakers -Return to clinic PRN   Thresa EMERSON Sar, DPM [facility]  Dr. Thresa EMERSON Sar, DPM    [address] [place], [place] [address]                 Office [phone]  Fax [phone]

## 2024-04-25 ENCOUNTER — Other Ambulatory Visit: Payer: Self-pay | Admitting: Family

## 2024-04-25 DIAGNOSIS — E669 Obesity, unspecified: Secondary | ICD-10-CM

## 2024-04-25 MED ORDER — METFORMIN HCL ER 500 MG PO TB24: 1500.0000 mg | ORAL_TABLET | Freq: Every day | ORAL | 3 refills | Status: DC

## 2024-05-17 ENCOUNTER — Other Ambulatory Visit

## 2024-05-17 DIAGNOSIS — R7989 Other specified abnormal findings of blood chemistry: Secondary | ICD-10-CM | POA: Diagnosis not present

## 2024-05-17 DIAGNOSIS — E669 Obesity, unspecified: Secondary | ICD-10-CM | POA: Diagnosis not present

## 2024-05-17 LAB — TSH: TSH: 5.3 u[IU]/mL (ref 0.35–5.50)

## 2024-05-17 LAB — T4, FREE: Free T4: 0.52 ng/dL — ABNORMAL LOW (ref 0.60–1.60)

## 2024-05-17 LAB — T3, FREE: T3, Free: 3.4 pg/mL (ref 2.3–4.2)

## 2024-05-20 ENCOUNTER — Ambulatory Visit: Admitting: Family

## 2024-06-04 ENCOUNTER — Ambulatory Visit: Payer: Self-pay | Admitting: Family

## 2024-06-06 ENCOUNTER — Encounter: Payer: Self-pay | Admitting: Family

## 2024-06-06 ENCOUNTER — Ambulatory Visit: Admitting: Family

## 2024-06-06 DIAGNOSIS — E669 Obesity, unspecified: Secondary | ICD-10-CM | POA: Diagnosis not present

## 2024-06-06 MED ORDER — METFORMIN HCL ER 500 MG PO TB24: 2000.0000 mg | ORAL_TABLET | Freq: Every day | ORAL | 3 refills | Status: DC

## 2024-06-06 NOTE — Progress Notes (Signed)
 Assessment & Plan:  Obesity (BMI 30-39.9) -     metFORMIN  HCl ER; Take 4 tablets (2,000 mg total) by mouth daily.  Dispense: 360 tablet; Refill: 3     Return precautions given.   Risks, benefits, and alternatives of the medications and treatment plan prescribed today were discussed, and patient expressed understanding.   Education regarding symptom management and diagnosis given to patient on AVS either electronically or printed.  Return in about 6 months (around 12/05/2024).  Rollene Northern, FNP  Subjective:    Patient ID: Shelley Massey, female    DOB: 1966/03/02, 58 y.o.   MRN: [id number]  CC: Shelley Massey is a 58 y.o. female who presents today for follow up.   HPI: HPI Follow-up after starting metformin .  Zepbound  was denied Discussed the use of AI scribe software for clinical note transcription with the patient, who gave verbal consent to proceed.  History of Present Illness   Shelley Massey is a 58 year old female who presents for a follow-up visit.  She has lost weight, which she attributes to dietary changes. She uses a stationary bike for exercise, finding it easier than walking due to right foot discomfort after surgery.  She is trying to reduce starch in her diet, which she feels is helping with her weight management.  She is currently on metformin , taking 2000 mg daily, split into two doses of 1000 mg. She experiences occasional cramping and gas, which she attributes to the medication, but notes that it is manageable. She has been on the maximum dose for about a month and feels that her body is still adjusting.  Denies abdominal pain, constipation, diarrhea   02/01/2024 RT GREAT TOE ARTHROPLASTY W/ IMPLANT,   Allergies: Patient has no known allergies. Medications Ordered Prior to Encounter[1]  Review of Systems  Constitutional:  Negative for chills and fever.  Respiratory:  Negative for cough.   Cardiovascular:  Negative for chest pain and palpitations.   Gastrointestinal:  Negative for nausea and vomiting.      Objective:    BP 118/70   Pulse 68   Temp 97.8 F (36.6 C)   Ht 5' 1 (1.549 m)   Wt 175 lb (79.4 kg)   LMP  (LMP Unknown)   SpO2 95%   BMI 33.07 kg/m  BP Readings from Last 3 Encounters:  06/06/24 118/70  03/21/24 120/62  12/19/23 120/76   Wt Readings from Last 3 Encounters:  06/06/24 175 lb (79.4 kg)  04/19/24 182 lb 12.8 oz (82.9 kg)  03/21/24 182 lb 12.8 oz (82.9 kg)    Physical Exam Vitals reviewed.  Constitutional:      Appearance: She is well-developed.  Eyes:     Conjunctiva/sclera: Conjunctivae normal.  Cardiovascular:     Rate and Rhythm: Normal rate and regular rhythm.     Pulses: Normal pulses.     Heart sounds: Normal heart sounds.  Pulmonary:     Effort: Pulmonary effort is normal.     Breath sounds: Normal breath sounds. No wheezing, rhonchi or rales.  Skin:    General: Skin is warm and dry.  Neurological:     Mental Status: She is alert.  Psychiatric:        Speech: Speech normal.        Behavior: Behavior normal.        Thought Content: Thought content normal.           [1]  Current Outpatient Medications on File Prior  to Visit  Medication Sig Dispense Refill   amLODipine  (NORVASC ) 10 MG tablet Take 1 tablet (10 mg total) by mouth daily. 90 tablet 3   aspirin 81 MG tablet Take 81 mg by mouth daily.     atenolol  (TENORMIN ) 50 MG tablet TAKE 1 TABLET BY MOUTH ONCE DAILY . APPOINTMENT REQUIRED FOR FUTURE REFILLS 90 tablet 3   cholecalciferol (VITAMIN D ) 1000 UNITS tablet Take 1,000 Units by mouth daily.     cyclobenzaprine  (FLEXERIL ) 5 MG tablet Take 1 tablet (5 mg total) by mouth at bedtime. 30 tablet 0   escitalopram  (LEXAPRO ) 10 MG tablet Take 1 tablet (10 mg total) by mouth daily. 90 tablet 3   Flaxseed, Linseed, (FLAX SEED OIL PO) Take by mouth.     hydrochlorothiazide  (HYDRODIURIL ) 25 MG tablet Take 1 tablet (25 mg total) by mouth daily. 90 tablet 3   ibuprofen  (ADVIL ) 800  MG tablet Take 1 tablet (800 mg total) by mouth 3 (three) times daily. 60 tablet 1   Omega-3 Fatty Acids (FISH OIL) 1200 MG CAPS Take 2 capsules by mouth daily.     ondansetron  (ZOFRAN ) 4 MG tablet Take 1 tablet (4 mg total) by mouth every 8 (eight) hours as needed for nausea or vomiting. 20 tablet 0   rosuvastatin  (CRESTOR ) 10 MG tablet Take 1 tablet by mouth in the evening 90 tablet 3   Vitamin E  400 UNITS TABS Take 2 tablets by mouth once daily 60 tablet 2   No current facility-administered medications on file prior to visit.

## 2024-06-10 ENCOUNTER — Inpatient Hospital Stay: Admission: RE | Admit: 2024-06-10 | Discharge: 2024-06-10 | Attending: Family

## 2024-06-10 DIAGNOSIS — Z1231 Encounter for screening mammogram for malignant neoplasm of breast: Secondary | ICD-10-CM

## 2024-06-14 ENCOUNTER — Other Ambulatory Visit: Payer: Self-pay | Admitting: Family

## 2024-06-14 DIAGNOSIS — R928 Other abnormal and inconclusive findings on diagnostic imaging of breast: Secondary | ICD-10-CM

## 2024-06-24 ENCOUNTER — Ambulatory Visit
Admission: RE | Admit: 2024-06-24 | Discharge: 2024-06-24 | Disposition: A | Source: Ambulatory Visit | Attending: Family

## 2024-06-24 ENCOUNTER — Ambulatory Visit
Admission: RE | Admit: 2024-06-24 | Discharge: 2024-06-24 | Disposition: A | Discharge status: Home / Self Care | Source: Ambulatory Visit | Attending: Family | Admitting: Family

## 2024-06-24 DIAGNOSIS — N6001 Solitary cyst of right breast: Secondary | ICD-10-CM | POA: Diagnosis not present

## 2024-06-24 DIAGNOSIS — R928 Other abnormal and inconclusive findings on diagnostic imaging of breast: Secondary | ICD-10-CM

## 2024-06-24 DIAGNOSIS — N6310 Unspecified lump in the right breast, unspecified quadrant: Secondary | ICD-10-CM | POA: Diagnosis not present

## 2024-06-24 DIAGNOSIS — R92321 Mammographic fibroglandular density, right breast: Secondary | ICD-10-CM | POA: Diagnosis not present

## 2024-07-04 ENCOUNTER — Ambulatory Visit: Admitting: Family

## 2024-07-23 ENCOUNTER — Ambulatory Visit: Admitting: Family

## 2024-07-23 ENCOUNTER — Encounter: Payer: Self-pay | Admitting: Family

## 2024-07-23 VITALS — BP 122/74 | HR 76 | Temp 98.3°F | Ht 61.0 in | Wt 177.6 lb

## 2024-07-23 DIAGNOSIS — I10 Essential (primary) hypertension: Secondary | ICD-10-CM | POA: Diagnosis not present

## 2024-07-23 DIAGNOSIS — E669 Obesity, unspecified: Secondary | ICD-10-CM

## 2024-07-23 DIAGNOSIS — R7309 Other abnormal glucose: Secondary | ICD-10-CM | POA: Diagnosis not present

## 2024-07-23 DIAGNOSIS — R232 Flushing: Secondary | ICD-10-CM | POA: Diagnosis not present

## 2024-07-23 LAB — POCT GLYCOSYLATED HEMOGLOBIN (HGB A1C): HbA1c POC (<> result, manual entry): 5.6 %

## 2024-07-23 MED ORDER — WEGOVY 1.5 MG PO TABS: 1.5000 mg | ORAL_TABLET | Freq: Every day | ORAL | 0 refills | Status: AC

## 2024-07-23 MED ORDER — ATENOLOL 50 MG PO TABS: ORAL_TABLET | ORAL | 3 refills | Status: AC

## 2024-07-23 MED ORDER — AMLODIPINE BESYLATE 10 MG PO TABS: 10.0000 mg | ORAL_TABLET | Freq: Every day | ORAL | 3 refills | Status: AC

## 2024-07-23 MED ORDER — HYDROCHLOROTHIAZIDE 25 MG PO TABS: 25.0000 mg | ORAL_TABLET | Freq: Every day | ORAL | 3 refills | Status: AC

## 2024-07-23 MED ORDER — ESCITALOPRAM OXALATE 10 MG PO TABS: 10.0000 mg | ORAL_TABLET | Freq: Every day | ORAL | 3 refills | Status: AC

## 2024-07-23 NOTE — Progress Notes (Unsigned)
 "  Assessment & Plan:  Essential hypertension -     hydroCHLOROthiazide ; Take 1 tablet (25 mg total) by mouth daily.  Dispense: 90 tablet; Refill: 3 -     Atenolol ; TAKE 1 TABLET BY MOUTH ONCE DAILY . APPOINTMENT REQUIRED FOR FUTURE REFILLS  Dispense: 90 tablet; Refill: 3 -     amLODIPine  Besylate; Take 1 tablet (10 mg total) by mouth daily.  Dispense: 90 tablet; Refill: 3  Elevated glucose -     POCT glycosylated hemoglobin (Hb A1C)  Hot flashes -     Escitalopram  Oxalate; Take 1 tablet (10 mg total) by mouth daily.  Dispense: 90 tablet; Refill: 3  Obesity (BMI 30-39.9) Assessment & Plan: Stop metformin .  Counseled on blackbox warning and also serious side effects of Wegovy , mechanism of action, titration.  We will start Wegovy  1.5 mg daily and very slowly titrate.  Patient will let me know how she is doing.   Primary hypertension Assessment & Plan: Chronic, stable.  Continue atenolol  50 mg qd,  hctz 25mg  qd, amlodipine  10mg  qd   Other orders -     Wegovy ; Take 1 tablet (1.5 mg total) by mouth daily. Daily in AM on an empty stomach with 4 oz of water. Do not eat or drink for 30 minutes after dose.  Dispense: 30 tablet; Refill: 0     Return precautions given.   Risks, benefits, and alternatives of the medications and treatment plan prescribed today were discussed, and patient expressed understanding.   Education regarding symptom management and diagnosis given to patient on AVS either electronically or printed.  No follow-ups on file.  Rollene Northern, FNP  Subjective:    Patient ID: Shelley Massey, female    DOB: 1965/07/09, 59 y.o.   MRN: [id number]  CC: VANNESA ABAIR is a 59 y.o. female who presents today for follow up.   HPI: She is walking on treadmill at work and riding her bike at home.   She is pleased with weight loss.   She has been pleased metformin  and tolerated 2000 mg daily however she is interested in starting an oral GLP-1 agonist.  Previously her  insurance did not cover this medication.   Starting GLP agonist / GIP/GLP:   Denies family history medullary thyroid  cancer, multiple endocrine neoplasia. Denies personal history of pancreatitis, multiple endocrine neoplasia, chronic constipation. Eye exam is up-to-date.  No known history of retinopathy  She remains compliant with hydrochlorothiazide , atenolol  and amlodipine .  She is pleased with blood pressure regimen regimen.  Denies chest pain  Allergies: Patient has no known allergies. Medications Ordered Prior to Encounter[1]  Review of Systems  Constitutional:  Negative for chills and fever.  Respiratory:  Negative for cough.   Cardiovascular:  Negative for chest pain and palpitations.  Gastrointestinal:  Negative for nausea and vomiting.      Objective:    BP 122/74   Pulse 76   Temp 98.3 F (36.8 C) (Oral)   Ht 5' 1 (1.549 m)   Wt 177 lb 9.6 oz (80.6 kg)   LMP  (LMP Unknown)   SpO2 93%   BMI 33.56 kg/m  BP Readings from Last 3 Encounters:  07/23/24 122/74  06/06/24 118/70  03/21/24 120/62   Wt Readings from Last 3 Encounters:  07/23/24 177 lb 9.6 oz (80.6 kg)  06/06/24 175 lb (79.4 kg)  04/19/24 182 lb 12.8 oz (82.9 kg)    Physical Exam Vitals reviewed.  Constitutional:      Appearance:  She is well-developed.  Eyes:     Conjunctiva/sclera: Conjunctivae normal.  Cardiovascular:     Rate and Rhythm: Normal rate and regular rhythm.     Pulses: Normal pulses.     Heart sounds: Normal heart sounds.  Pulmonary:     Effort: Pulmonary effort is normal.     Breath sounds: Normal breath sounds. No wheezing, rhonchi or rales.  Skin:    General: Skin is warm and dry.  Neurological:     Mental Status: She is alert.  Psychiatric:        Speech: Speech normal.        Behavior: Behavior normal.        Thought Content: Thought content normal.            [1]  Current Outpatient Medications on File Prior to Visit  Medication Sig Dispense Refill    aspirin 81 MG tablet Take 81 mg by mouth daily.     cholecalciferol (VITAMIN D ) 1000 UNITS tablet Take 1,000 Units by mouth daily.     cyclobenzaprine  (FLEXERIL ) 5 MG tablet Take 1 tablet (5 mg total) by mouth at bedtime. 30 tablet 0   Flaxseed, Linseed, (FLAX SEED OIL PO) Take by mouth.     ibuprofen  (ADVIL ) 800 MG tablet Take 1 tablet (800 mg total) by mouth 3 (three) times daily. 60 tablet 1   Omega-3 Fatty Acids (FISH OIL) 1200 MG CAPS Take 2 capsules by mouth daily.     ondansetron  (ZOFRAN ) 4 MG tablet Take 1 tablet (4 mg total) by mouth every 8 (eight) hours as needed for nausea or vomiting. 20 tablet 0   rosuvastatin  (CRESTOR ) 10 MG tablet Take 1 tablet by mouth in the evening 90 tablet 3   Vitamin E  400 UNITS TABS Take 2 tablets by mouth once daily 60 tablet 2   No current facility-administered medications on file prior to visit.   "

## 2024-07-23 NOTE — Patient Instructions (Addendum)
 STOP metformin   Trial of wegovy  PILL 1.5mg  for 30 days.   Please reference to wegovy .com manufacture website in regards to cost.   Coupon is online or they can text SAVE to [phone].    https://www.wegovy .com/obesity/what-to-pay-for-wegovy .html?tc=[email or web address]&cc=2003&utm_medium=cpc&utm_source=miso&utm_campaign=wegovy_oral_launch&utm_content=product_information_[email or web address]&showisi=true&[email or web address]&gclsrc=3p.ds&&utm_source=bing&utm_medium=cpc&utm_term=wegovy %20savings&utm_campaign=&utm_content=-dc_pcrid_[id number]_pkw_wegovy%20savings_pmt_bp_slid__product_&pgrid=[id number]&ptaid=[id number]:loc-190&msclkid=[id number]   It is important to take this medication on an empty stomach and with 4 ounces of water.  Wait at least 30 minutes before eating, drinking or taking other oral medications.   We have discussed starting non insulin daily injectable medication called Wegovy   which is a glucagon like peptide (GLP 1) agonist and works by delaying gastric emptying and increasing insulin secretion.It is given once per week. Most patients see significant weight loss with this drug class.   You may NOT take either medication if you or your family has history of thyroid , parathyroid, OR adrenal cancer. Please confirm you and your family does NOT have this history as this drug class has black box warning on this medication for that reason.   Please follow  directions on prescription and slowly increase the medication for your safety over time.   We can slowly titrate further at follow up with goal of no more than 1-2 lbs weight loss per week.  Semaglutide  (Wegovy )  Dose (mg) Once Weekly Titration:    If a dose is not tolerated, consider delaying further dose increases for  another 4 weeks.  If you are actively losing weight on a dose, do not increase medication.   Days 1-30  1.5mg  daily  Days 31-60  4 mg daily  Days 61-90 9mg   daily  Maintenance Dose 25mg  daily

## 2024-07-24 NOTE — Assessment & Plan Note (Signed)
 Stop metformin .  Counseled on blackbox warning and also serious side effects of Wegovy , mechanism of action, titration.  We will start Wegovy  1.5 mg daily and very slowly titrate.  Patient will let me know how she is doing.

## 2024-07-24 NOTE — Assessment & Plan Note (Signed)
 Chronic, stable.  Continue atenolol 50 mg qd,  hctz '25mg'$  qd, amlodipine '10mg'$  qd

## 2024-11-21 ENCOUNTER — Ambulatory Visit: Admitting: Family

## 2024-12-06 ENCOUNTER — Ambulatory Visit: Admitting: Family
